# Patient Record
Sex: Female | Born: 1935
Health system: Southern US, Academic
[De-identification: ages and names within clinical notes are randomized; demographics above are authoritative.]

## PROBLEM LIST (undated history)

## (undated) DIAGNOSIS — IMO0002 Reserved for concepts with insufficient information to code with codable children: Secondary | ICD-10-CM

## (undated) DIAGNOSIS — E785 Hyperlipidemia, unspecified: Secondary | ICD-10-CM

## (undated) DIAGNOSIS — K5909 Other constipation: Secondary | ICD-10-CM

## (undated) DIAGNOSIS — F419 Anxiety disorder, unspecified: Secondary | ICD-10-CM

## (undated) DIAGNOSIS — G35 Multiple sclerosis: Secondary | ICD-10-CM

## (undated) DIAGNOSIS — R0981 Nasal congestion: Secondary | ICD-10-CM

---

## 1998-10-06 ENCOUNTER — Ambulatory Visit (HOSPITAL_COMMUNITY): Payer: Self-pay

## 2013-11-16 ENCOUNTER — Emergency Department (HOSPITAL_COMMUNITY): Payer: Medicare PPO

## 2013-11-16 ENCOUNTER — Emergency Department (HOSPITAL_COMMUNITY)
Admission: EM | Admit: 2013-11-16 | Discharge: 2013-11-16 | Disposition: A | Discharge status: Home / Self Care | Payer: Medicare PPO | Attending: Emergency Medicine | Admitting: Emergency Medicine

## 2013-11-16 ENCOUNTER — Encounter (HOSPITAL_COMMUNITY): Payer: Self-pay | Admitting: Emergency Medicine

## 2013-11-16 DIAGNOSIS — S1093XA Contusion of unspecified part of neck, initial encounter: Principal | ICD-10-CM

## 2013-11-16 DIAGNOSIS — IMO0002 Reserved for concepts with insufficient information to code with codable children: Secondary | ICD-10-CM | POA: Insufficient documentation

## 2013-11-16 DIAGNOSIS — Y9210 Unspecified residential institution as the place of occurrence of the external cause: Secondary | ICD-10-CM | POA: Insufficient documentation

## 2013-11-16 DIAGNOSIS — Z79899 Other long term (current) drug therapy: Secondary | ICD-10-CM | POA: Insufficient documentation

## 2013-11-16 DIAGNOSIS — S0083XA Contusion of other part of head, initial encounter: Principal | ICD-10-CM

## 2013-11-16 DIAGNOSIS — Z791 Long term (current) use of non-steroidal anti-inflammatories (NSAID): Secondary | ICD-10-CM | POA: Insufficient documentation

## 2013-11-16 DIAGNOSIS — E785 Hyperlipidemia, unspecified: Secondary | ICD-10-CM | POA: Insufficient documentation

## 2013-11-16 DIAGNOSIS — Z8669 Personal history of other diseases of the nervous system and sense organs: Secondary | ICD-10-CM | POA: Insufficient documentation

## 2013-11-16 DIAGNOSIS — F411 Generalized anxiety disorder: Secondary | ICD-10-CM | POA: Insufficient documentation

## 2013-11-16 DIAGNOSIS — Y9389 Activity, other specified: Secondary | ICD-10-CM | POA: Insufficient documentation

## 2013-11-16 DIAGNOSIS — R296 Repeated falls: Secondary | ICD-10-CM | POA: Insufficient documentation

## 2013-11-16 DIAGNOSIS — M1990 Unspecified osteoarthritis, unspecified site: Secondary | ICD-10-CM | POA: Insufficient documentation

## 2013-11-16 DIAGNOSIS — S0003XA Contusion of scalp, initial encounter: Secondary | ICD-10-CM | POA: Insufficient documentation

## 2013-11-16 HISTORY — DX: Hyperlipidemia, unspecified: E78.5

## 2013-11-16 HISTORY — DX: Reserved for concepts with insufficient information to code with codable children: IMO0002

## 2013-11-16 HISTORY — DX: Multiple sclerosis: G35

## 2013-11-16 HISTORY — DX: Anxiety disorder, unspecified: F41.9

## 2013-11-16 HISTORY — DX: Nasal congestion: R09.81

## 2013-11-16 HISTORY — DX: Other constipation: K59.09

## 2013-11-16 NOTE — ED Provider Notes (Signed)
CSN: [id number]     Arrival date & time 11/16/13  1003 History   First MD Initiated Contact with Patient 11/16/13 1007     Chief Complaint  Patient presents with  . Fall    HPI Patient from assisted living facility fell in the bathroom suffering head injury.  No loss of consciousness.  Patient denies headache or nausea or vomiting.  Patient does have hematoma to the posterior occipital area of the head.  Patient denies neck pain or numbness.  Patient denies hip pain or rib pain. Past Medical History  Diagnosis Date  . Chronic constipation   . Hyperlipidemia   . Anxiety   . Degenerative disc disease   . Multiple sclerosis   . Chronic nasal congestion    History reviewed. No pertinent past surgical history. No family history on file. History  Substance Use Topics  . Smoking status: Never Smoker   . Smokeless tobacco: Never Used  . Alcohol Use: No   OB History   Grav Para Term Preterm Abortions TAB SAB Ect Mult Living                 Review of Systems All other systems reviewed and are negative Allergies  Review of patient's allergies indicates no known allergies.  Home Medications   Current Outpatient Rx  Name  Route  Sig  Dispense  Refill  . acetaminophen (TYLENOL) 500 MG tablet   Oral   Take 500 mg by mouth 2 (two) times daily as needed for moderate pain.         . cholecalciferol (VITAMIN D) 1000 UNITS tablet   Oral   Take 1,000 Units by mouth daily.         Marland Kitchen. docusate sodium (COLACE) 100 MG capsule   Oral   Take 100 mg by mouth 2 (two) times daily.         . fluticasone (FLONASE) 50 MCG/ACT nasal spray   Each Nare   Place 1 spray into both nostrils daily.         Marland Kitchen. levothyroxine (SYNTHROID, LEVOTHROID) 25 MCG tablet   Oral   Take 25 mcg by mouth daily before breakfast.         . magnesium oxide (MAG-OX) 400 MG tablet   Oral   Take 400 mg by mouth at bedtime.         . meloxicam (MOBIC) 15 MG tablet   Oral   Take 15 mg by mouth daily.           . Multiple Vitamin (MULTIVITAMIN WITH MINERALS) TABS tablet   Oral   Take 1 tablet by mouth daily.         Marland Kitchen. omega-3 acid ethyl esters (LOVAZA) 1 G capsule   Oral   Take 1 g by mouth daily.         . potassium chloride (K-DUR,KLOR-CON) 10 MEQ tablet   Oral   Take 10 mEq by mouth daily.         . psyllium (HYDROCIL/METAMUCIL) 95 % PACK   Oral   Take 1 packet by mouth 2 (two) times daily.         . rosuvastatin (CRESTOR) 5 MG tablet   Oral   Take 5 mg by mouth at bedtime.          BP 150/78  Pulse 70  Temp(Src) 97.8 F (36.6 C) (Oral)  Resp 12  SpO2 98% Physical Exam  Nursing note and vitals reviewed. Constitutional: She  is oriented to person, place, and time. She appears well-developed and well-nourished. No distress.  HENT:  Head: Normocephalic.    Eyes: Pupils are equal, round, and reactive to light.  Neck: Normal range of motion.  Cardiovascular: Normal rate and intact distal pulses.   Pulmonary/Chest: No respiratory distress.  Abdominal: Normal appearance. She exhibits no distension.  Musculoskeletal: Normal range of motion.       Right hip: She exhibits normal range of motion and no tenderness.       Left hip: She exhibits normal range of motion and no tenderness.  Neurological: She is alert and oriented to person, place, and time. No cranial nerve deficit.  Skin: Skin is warm and dry. No rash noted.  Psychiatric: She has a normal mood and affect. Her behavior is normal.    ED Course  Procedures (including critical care time) Labs Review Labs Reviewed - No data to display Imaging Review Ct Head Wo Contrast    IMPRESSION: 1. No acute intracranial abnormality. 2. Right occipital scalp hematoma without evidence of underlying calvarial fracture. 3. 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   Electronically Signed   By: Malachy Moan M.D.   On: 11/16/2013 10:35      MDM   1. Scalp hematoma, initial  encounter        Nelia Shi, MD 11/16/13 1043

## 2013-11-16 NOTE — ED Notes (Addendum)
Per EMS pt from assisted living facility fell getting ready in bathroom. Pt sustained hematoma to right posterior portion of head. Pt denies pain at present time. No LOC. Patient remembers entire event.

## 2013-11-16 NOTE — ED Notes (Signed)
Bed: WA20 Expected date: 11/16/13 Expected time: 9:58 AM Means of arrival: Ambulance Comments: Fall

## 2013-11-16 NOTE — Discharge Instructions (Signed)
Contusion A contusion is a deep bruise. Contusions happen when an injury causes bleeding under the skin. Signs of bruising include pain, puffiness (swelling), and discolored skin. The contusion may turn blue, purple, or yellow. HOME CARE   Put ice on the injured area.  Put ice in a plastic bag.  Place a towel between your skin and the bag.  Leave the ice on for 15-20 minutes, 03-04 times a day.  Only take medicine as told by your doctor.  Rest the injured area.  If possible, raise (elevate) the injured area to lessen puffiness. GET HELP RIGHT AWAY IF:   You have more bruising or puffiness.  You have pain that is getting worse.  Your puffiness or pain is not helped by medicine. MAKE SURE YOU:   Understand these instructions.  Will watch your condition.  Will get help right away if you are not doing well or get worse. Document Released: 04/18/2008 Document Revised: 01/23/2012 Document Reviewed: 09/05/2011 [facility] Patient Information 2014 [place], [place].  Head Injury, Adult A head injury happens when the head is hit really hard. A head injury may cause sleepiness, headache, throwing up (vomiting), and problems seeing. If the head injury is really bad, you may need to stay in the hospital. HOME CARE  Have someone with you for the first 24 hours. This person should wake you up every 02-03 hours to check on your condition.  Only drink water or clear fluid for the rest of the day. Then, go back to your regular diet.  Only take medicines as told by your doctor. Do not take aspirin.  Do not drink alcohol for 2 days.  Do not take medicines that help your relax (sedatives) for 2 days. Side effects may happen for up to 7 to 10 days. Watch for new problems. GET HELP RIGHT AWAY IF:   You are confused or sleepy.  You cannot be woken up.  You feel sick to your stomach (nauseous) or keep throwing up.  Your dizziness or unsteadiness is get worse, or your cannot walk.  You  start to shake (convulse) or pass out (faint).  You have very bad, lasting headaches that are not helped by medicine.  You cannot use your arms or legs like normal.  You have clear or bloody fluid coming from your nose or ears. MAKE SURE YOU:   Understand these instructions.  Will watch your condition.  Will get help right away if you are not doing well or get worse. Document Released: 10/13/2008 Document Revised: 01/23/2012 Document Reviewed: 09/16/2009 [facility] Patient Information 2014 [place], [place].

## 2014-03-06 ENCOUNTER — Emergency Department (HOSPITAL_COMMUNITY): Payer: Medicare PPO

## 2014-03-06 ENCOUNTER — Encounter (HOSPITAL_COMMUNITY): Payer: Self-pay | Admitting: Emergency Medicine

## 2014-03-06 ENCOUNTER — Observation Stay (HOSPITAL_COMMUNITY): Payer: Medicare PPO

## 2014-03-06 ENCOUNTER — Observation Stay (HOSPITAL_COMMUNITY)
Admission: EM | Admit: 2014-03-06 | Discharge: 2014-03-09 | Disposition: A | Discharge status: Skilled Nursing Facility | Payer: Medicare PPO | Attending: General Surgery | Admitting: General Surgery

## 2014-03-06 DIAGNOSIS — Y92009 Unspecified place in unspecified non-institutional (private) residence as the place of occurrence of the external cause: Secondary | ICD-10-CM | POA: Insufficient documentation

## 2014-03-06 DIAGNOSIS — W010XXA Fall on same level from slipping, tripping and stumbling without subsequent striking against object, initial encounter: Secondary | ICD-10-CM | POA: Insufficient documentation

## 2014-03-06 DIAGNOSIS — M79609 Pain in unspecified limb: Secondary | ICD-10-CM | POA: Insufficient documentation

## 2014-03-06 DIAGNOSIS — J3489 Other specified disorders of nose and nasal sinuses: Secondary | ICD-10-CM | POA: Insufficient documentation

## 2014-03-06 DIAGNOSIS — W19XXXA Unspecified fall, initial encounter: Secondary | ICD-10-CM

## 2014-03-06 DIAGNOSIS — S42109A Fracture of unspecified part of scapula, unspecified shoulder, initial encounter for closed fracture: Secondary | ICD-10-CM

## 2014-03-06 DIAGNOSIS — IMO0002 Reserved for concepts with insufficient information to code with codable children: Secondary | ICD-10-CM | POA: Insufficient documentation

## 2014-03-06 DIAGNOSIS — E785 Hyperlipidemia, unspecified: Secondary | ICD-10-CM | POA: Insufficient documentation

## 2014-03-06 DIAGNOSIS — S2249XA Multiple fractures of ribs, unspecified side, initial encounter for closed fracture: Principal | ICD-10-CM | POA: Diagnosis present

## 2014-03-06 DIAGNOSIS — M25551 Pain in right hip: Secondary | ICD-10-CM

## 2014-03-06 DIAGNOSIS — K5900 Constipation, unspecified: Secondary | ICD-10-CM | POA: Insufficient documentation

## 2014-03-06 DIAGNOSIS — S2241XA Multiple fractures of ribs, right side, initial encounter for closed fracture: Secondary | ICD-10-CM

## 2014-03-06 DIAGNOSIS — S42101A Fracture of unspecified part of scapula, right shoulder, initial encounter for closed fracture: Secondary | ICD-10-CM | POA: Diagnosis present

## 2014-03-06 DIAGNOSIS — G35 Multiple sclerosis: Secondary | ICD-10-CM | POA: Insufficient documentation

## 2014-03-06 DIAGNOSIS — S42199A Fracture of other part of scapula, unspecified shoulder, initial encounter for closed fracture: Secondary | ICD-10-CM | POA: Insufficient documentation

## 2014-03-06 DIAGNOSIS — N390 Urinary tract infection, site not specified: Secondary | ICD-10-CM | POA: Insufficient documentation

## 2014-03-06 DIAGNOSIS — S2239XA Fracture of one rib, unspecified side, initial encounter for closed fracture: Secondary | ICD-10-CM

## 2014-03-06 LAB — COMPREHENSIVE METABOLIC PANEL
ALK PHOS: 89 U/L (ref 39–117)
ALT: 20 U/L (ref 0–35)
AST: 29 U/L (ref 0–37)
Albumin: 3.8 g/dL (ref 3.5–5.2)
BILIRUBIN TOTAL: 0.4 mg/dL (ref 0.3–1.2)
BUN: 33 mg/dL — AB (ref 6–23)
CHLORIDE: 102 meq/L (ref 96–112)
CO2: 28 meq/L (ref 19–32)
Calcium: 9.4 mg/dL (ref 8.4–10.5)
Creatinine, Ser: 0.53 mg/dL (ref 0.50–1.10)
GFR calc non Af Amer: 79 mL/min — ABNORMAL LOW (ref >90)
GLUCOSE: 106 mg/dL — AB (ref 70–99)
POTASSIUM: 4.2 meq/L (ref 3.7–5.3)
SODIUM: 142 meq/L (ref 137–147)
TOTAL PROTEIN: 6.9 g/dL (ref 6.0–8.3)

## 2014-03-06 LAB — URINALYSIS, ROUTINE W REFLEX MICROSCOPIC
BILIRUBIN URINE: NEGATIVE
Glucose, UA: NEGATIVE mg/dL
Hgb urine dipstick: NEGATIVE
KETONES UR: 15 mg/dL — AB
Leukocytes, UA: NEGATIVE
NITRITE: POSITIVE — AB
PROTEIN: NEGATIVE mg/dL
Specific Gravity, Urine: 1.02 (ref 1.005–1.030)
Urobilinogen, UA: 0.2 mg/dL (ref 0.0–1.0)
pH: 7 (ref 5.0–8.0)

## 2014-03-06 LAB — CBC WITH DIFFERENTIAL/PLATELET
Basophils Absolute: 0 10*3/uL (ref 0.0–0.1)
Basophils Relative: 0 % (ref 0–1)
EOS ABS: 0 10*3/uL (ref 0.0–0.7)
Eosinophils Relative: 0 % (ref 0–5)
HCT: 42 % (ref 36.0–46.0)
Hemoglobin: 13.5 g/dL (ref 12.0–15.0)
LYMPHS PCT: 8 % — AB (ref 12–46)
Lymphs Abs: 0.9 10*3/uL (ref 0.7–4.0)
MCH: 30 pg (ref 26.0–34.0)
MCHC: 32.1 g/dL (ref 30.0–36.0)
MCV: 93.3 fL (ref 78.0–100.0)
MONOS PCT: 5 % (ref 3–12)
Monocytes Absolute: 0.6 10*3/uL (ref 0.1–1.0)
NEUTROS ABS: 10.4 10*3/uL — AB (ref 1.7–7.7)
NEUTROS PCT: 87 % — AB (ref 43–77)
PLATELETS: 168 10*3/uL (ref 150–400)
RBC: 4.5 MIL/uL (ref 3.87–5.11)
RDW: 13.3 % (ref 11.5–15.5)
WBC: 12 10*3/uL — AB (ref 4.0–10.5)

## 2014-03-06 LAB — URINE MICROSCOPIC-ADD ON

## 2014-03-06 MED ORDER — FAMOTIDINE 20 MG PO TABS
20.0000 mg | ORAL_TABLET | Freq: Every day | ORAL | Status: DC
  Administered 2014-03-06 – 2014-03-08 (×3): 20 mg via ORAL
  Filled 2014-03-06 (×5): qty 1

## 2014-03-06 MED ORDER — MELOXICAM 7.5 MG PO TABS
7.5000 mg | ORAL_TABLET | Freq: Every day | ORAL | Status: DC | PRN
  Filled 2014-03-06: qty 1

## 2014-03-06 MED ORDER — FENTANYL CITRATE 0.05 MG/ML IJ SOLN
25.0000 ug | Freq: Once | INTRAMUSCULAR | Status: AC
Start: 2014-03-06 — End: 2014-03-06
  Administered 2014-03-06: 25 ug via INTRAVENOUS
  Filled 2014-03-06: qty 2

## 2014-03-06 MED ORDER — DEXTROSE-NACL 5-0.45 % IV SOLN
INTRAVENOUS | Status: DC
  Administered 2014-03-06 – 2014-03-07 (×2): via INTRAVENOUS

## 2014-03-06 MED ORDER — FENTANYL CITRATE 0.05 MG/ML IJ SOLN
50.0000 ug | Freq: Once | INTRAMUSCULAR | Status: AC
  Administered 2014-03-06: 50 ug via INTRAVENOUS
  Filled 2014-03-06: qty 2

## 2014-03-06 MED ORDER — ONDANSETRON HCL 4 MG/2ML IJ SOLN: 4.0000 mg | Freq: Four times a day (QID) | INTRAMUSCULAR | Status: DC | PRN

## 2014-03-06 MED ORDER — NEPAFENAC 0.1 % OP SUSP
1.0000 [drp] | Freq: Three times a day (TID) | OPHTHALMIC | Status: DC
  Administered 2014-03-06 – 2014-03-09 (×9): 1 [drp] via OPHTHALMIC
  Filled 2014-03-06: qty 3

## 2014-03-06 MED ORDER — POTASSIUM CHLORIDE CRYS ER 10 MEQ PO TBCR
10.0000 meq | EXTENDED_RELEASE_TABLET | Freq: Every day | ORAL | Status: DC
  Administered 2014-03-06 – 2014-03-09 (×4): 10 meq via ORAL
  Filled 2014-03-06 (×4): qty 1

## 2014-03-06 MED ORDER — HYDROCODONE-ACETAMINOPHEN 5-325 MG PO TABS: 0.5000 | ORAL_TABLET | Freq: Four times a day (QID) | ORAL | Status: DC | PRN

## 2014-03-06 MED ORDER — POLYETHYL GLYCOL-PROPYL GLYCOL 0.4-0.3 % OP SOLN: 1.0000 [drp] | Freq: Every evening | OPHTHALMIC | Status: DC | PRN

## 2014-03-06 MED ORDER — DOCUSATE SODIUM 100 MG PO CAPS
100.0000 mg | ORAL_CAPSULE | Freq: Two times a day (BID) | ORAL | Status: DC
  Administered 2014-03-06 – 2014-03-09 (×6): 100 mg via ORAL
  Filled 2014-03-06 (×6): qty 1

## 2014-03-06 MED ORDER — POLYVINYL ALCOHOL 1.4 % OP SOLN
1.0000 [drp] | Freq: Every evening | OPHTHALMIC | Status: DC | PRN
  Filled 2014-03-06: qty 15

## 2014-03-06 MED ORDER — IOHEXOL 300 MG/ML  SOLN
80.0000 mL | Freq: Once | INTRAMUSCULAR | Status: AC | PRN
Start: 2014-03-06 — End: 2014-03-06
  Administered 2014-03-06: 80 mL via INTRAVENOUS

## 2014-03-06 MED ORDER — FLUTICASONE PROPIONATE 50 MCG/ACT NA SUSP
1.0000 | Freq: Two times a day (BID) | NASAL | Status: DC | PRN
  Filled 2014-03-06: qty 16

## 2014-03-06 MED ORDER — PSYLLIUM 95 % PO PACK
1.0000 | PACK | Freq: Two times a day (BID) | ORAL | Status: DC
  Administered 2014-03-06 – 2014-03-09 (×6): 1 via ORAL
  Filled 2014-03-06 (×7): qty 1

## 2014-03-06 MED ORDER — ONDANSETRON HCL 4 MG PO TABS: 4.0000 mg | ORAL_TABLET | Freq: Four times a day (QID) | ORAL | Status: DC | PRN

## 2014-03-06 MED ORDER — ENOXAPARIN SODIUM 30 MG/0.3ML ~~LOC~~ SOLN
30.0000 mg | SUBCUTANEOUS | Status: DC
  Administered 2014-03-06 – 2014-03-08 (×3): 30 mg via SUBCUTANEOUS
  Filled 2014-03-06 (×4): qty 0.3

## 2014-03-06 MED ORDER — ATORVASTATIN CALCIUM 10 MG PO TABS
10.0000 mg | ORAL_TABLET | Freq: Every day | ORAL | Status: DC
  Administered 2014-03-07 – 2014-03-08 (×2): 10 mg via ORAL
  Filled 2014-03-06 (×3): qty 1

## 2014-03-06 MED ORDER — LEVOTHYROXINE SODIUM 25 MCG PO TABS
25.0000 ug | ORAL_TABLET | Freq: Every day | ORAL | Status: DC
  Administered 2014-03-07 – 2014-03-09 (×3): 25 ug via ORAL
  Filled 2014-03-06 (×4): qty 1

## 2014-03-06 MED ORDER — MORPHINE SULFATE 2 MG/ML IJ SOLN
1.0000 mg | INTRAMUSCULAR | Status: DC | PRN
  Administered 2014-03-06: 1 mg via INTRAVENOUS
  Administered 2014-03-07: 2 mg via INTRAVENOUS
  Filled 2014-03-06 (×2): qty 1

## 2014-03-06 MED ORDER — VITAMIN D3 25 MCG (1000 UNIT) PO TABS
1000.0000 [IU] | ORAL_TABLET | Freq: Every day | ORAL | Status: DC
  Administered 2014-03-06 – 2014-03-09 (×4): 1000 [IU] via ORAL
  Filled 2014-03-06 (×4): qty 1

## 2014-03-06 MED ORDER — NEPAFENAC 0.3 % OP SUSP: 1.0000 [drp] | Freq: Every day | OPHTHALMIC | Status: DC

## 2014-03-06 NOTE — ED Provider Notes (Signed)
S: [facility] 80 [facility] after turning in bathroom to put on clothes. She complains of R sided chest pain, R arm pain, R hip pain. On PE, VSS, pt in NAD. On PE, She has no sings of trauma to hea, no c-spine tenderness.  CT scans showed right 3 through 12 rib fractures, no femur fracture. She was accepted by Dr. Lindie SpruceWyatt was trauma surgery, and was transported here in stable condition. Dr. Roda ShuttersXu was also consulted as recommended MRI of the right hip.  O:  Filed Vitals:   03/06/14 1623  BP: 132/64  Pulse: 75  Temp:   Resp: 20   Patient has no neuro deficits. She has a GCS of 15. Heart and lung sounds are within normal limits. Breath sounds are equal bilaterally. Patient has normal blood pressure, heart rate. Secondary examination reveals exquisite tenderness to palpation in the right chest wall. Also reveals right hip pain. All extremities are neurovascularly intact.  A:  In summary, this is a 80 year old female, presenting from outside hospital after being accepted by trauma surgery, with imaging evidence of right rib fractures, with a stable examination, exquisite tenderness to palpation in the right chest wall , right hip. Patient is neurovascularly intact in all extremities.  P:  Considering her age, amount of rib fractures, the patient statistically has a higher rate of morbidity and mortality. Will followup trauma recommendations, orthopedic recommendations. We'll continue to monitor closely.  Patient has been admitted to the trauma service. She is being transported in stable condition.  Lindsey BostonStirling Maleeyah Mccaughey, MD 03/07/14 [phone]

## 2014-03-06 NOTE — ED Notes (Signed)
Pt at CT

## 2014-03-06 NOTE — ED Notes (Signed)
[facility] at bedside to transport pt 

## 2014-03-06 NOTE — Progress Notes (Signed)
[facility]anePeter [facility]imes of [facility] and also listed on facility demographic sheet is [facility] Grimes as pcp [facility] [phone]

## 2014-03-06 NOTE — ED Notes (Addendum)
Pt now states that she may have hit her head. Skin tear to L forearm. Bleeding minimal

## 2014-03-06 NOTE — ED Notes (Signed)
Bed: WA09 Expected date:  Expected time:  Means of arrival:  Comments: EMS 80yo F - Fall

## 2014-03-06 NOTE — ED Provider Notes (Signed)
CSN: [id number]     Arrival date & time 03/06/14  0905 History   First MD Initiated Contact with Patient 03/06/14 [phone]     Chief Complaint  Patient presents with  . Fall  . Arm Pain  . Chest Pain     (Consider location/radiation/quality/duration/timing/severity/associated sxs/prior Treatment) HPI Comments: Pt is a 80 y.o. female with Pmhx as above who presents with mechanical fall this morning after turning in bathroom to put on clothes. Denies dizziness. She states she landed on her back, but hit her head. No LOC.  She complains of R sided chest pain, R arm pain, R hip pain. She has chronic R sided weakness from MS, but states it is no different.   Patient is a 80 y.o. female presenting with fall. The history is provided by the patient. No language interpreter was used.  Fall This is a new problem. The current episode started less than 1 hour ago. The problem occurs rarely. The problem has not changed since onset.Associated symptoms include chest pain and abdominal pain. Pertinent negatives include no headaches and no shortness of breath. Associated symptoms comments: R arm pain, R hip pain. Exacerbated by: movement. Nothing relieves the symptoms. Treatments tried: immobilization. The treatment provided mild relief.    Past Medical History  Diagnosis Date  . Chronic constipation   . Hyperlipidemia   . Anxiety   . Degenerative disc disease   . Multiple sclerosis   . Chronic nasal congestion    History reviewed. No pertinent past surgical history. History reviewed. No pertinent family history. History  Substance Use Topics  . Smoking status: Never Smoker   . Smokeless tobacco: Never Used  . Alcohol Use: No   OB History   Grav Para Term Preterm Abortions TAB SAB Ect Mult Living                 Review of Systems  Constitutional: Negative for fever, chills, diaphoresis, activity change, appetite change and fatigue.  HENT: Negative for congestion, facial swelling, rhinorrhea and  sore throat.   Eyes: Negative for photophobia and discharge.  Respiratory: Negative for cough, chest tightness and shortness of breath.   Cardiovascular: Positive for chest pain. Negative for palpitations and leg swelling.  Gastrointestinal: Positive for abdominal pain. Negative for nausea, vomiting and diarrhea.  Endocrine: Negative for polydipsia and polyuria.  Genitourinary: Negative for dysuria, frequency, difficulty urinating and pelvic pain.  Musculoskeletal: Positive for arthralgias. Negative for back pain, neck pain and neck stiffness.  Skin: Negative for color change and wound.  Allergic/Immunologic: Negative for immunocompromised state.  Neurological: Negative for facial asymmetry, weakness, numbness and headaches.  Hematological: Does not bruise/bleed easily.  Psychiatric/Behavioral: Negative for confusion and agitation.      Allergies  Review of patient's allergies indicates no known allergies.  Home Medications   Prior to Admission medications   Medication Sig Start Date End Date Taking? Authorizing Provider  Multiple Vitamin (MULTIVITAMIN WITH MINERALS) TABS tablet Take 1 tablet by mouth daily.   Yes Historical Provider, MD  rosuvastatin (CRESTOR) 5 MG tablet Take 5 mg by mouth at bedtime.   Yes Historical Provider, MD  acetaminophen (TYLENOL) 500 MG tablet Take 500 mg by mouth 2 (two) times daily as needed for moderate pain.    Historical Provider, MD  cholecalciferol (VITAMIN D) 1000 UNITS tablet Take 1,000 Units by mouth daily.    Historical Provider, MD  docusate sodium (COLACE) 100 MG capsule Take 100 mg by mouth 2 (two) times daily.  Historical Provider, MD  fluticasone (FLONASE) 50 MCG/ACT nasal spray Place 1 spray into both nostrils daily.    Historical Provider, MD  levothyroxine (SYNTHROID, LEVOTHROID) 25 MCG tablet Take 25 mcg by mouth daily before breakfast.    Historical Provider, MD  magnesium oxide (MAG-OX) 400 MG tablet Take 400 mg by mouth at bedtime.     Historical Provider, MD  meloxicam (MOBIC) 15 MG tablet Take 15 mg by mouth daily.    Historical Provider, MD  omega-3 acid ethyl esters (LOVAZA) 1 G capsule Take 1 g by mouth daily.    Historical Provider, MD  potassium chloride (K-DUR,KLOR-CON) 10 MEQ tablet Take 10 mEq by mouth daily.    Historical Provider, MD  psyllium (HYDROCIL/METAMUCIL) 95 % PACK Take 1 packet by mouth 2 (two) times daily.    Historical Provider, MD   BP 122/48  Pulse 61  Temp(Src) 97.7 F (36.5 C) (Oral)  Resp 18  Ht 5\' 2"  (1.575 m)  Wt 120 lb (54.432 kg)  BMI 21.94 kg/m2  SpO2 99% Physical Exam  Constitutional: She is oriented to person, place, and time. She appears well-developed and well-nourished. No distress.  HENT:  Head: Normocephalic and atraumatic.  Mouth/Throat: No oropharyngeal exudate.  Eyes: Pupils are equal, round, and reactive to light.  Neck: Normal range of motion. Neck supple.  Cardiovascular: Normal rate, regular rhythm and normal heart sounds.  Exam reveals no gallop and no friction rub.   No murmur heard. Pulmonary/Chest: Effort normal and breath sounds normal. No respiratory distress. She has no wheezes. She has no rales. She exhibits tenderness.    Abdominal: Soft. Bowel sounds are normal. She exhibits no distension and no mass. There is no tenderness. There is no rebound and no guarding.    Musculoskeletal: Normal range of motion. She exhibits no edema.       Right shoulder: She exhibits bony tenderness and crepitus.       Right hip: She exhibits tenderness and bony tenderness. She exhibits no swelling, no crepitus and no deformity.       Arms:      Legs: Neurological: She is alert and oriented to person, place, and time. She displays no tremor. No sensory deficit. Abnormal gait: not tested due to injuries. GCS eye subscore is 4. GCS verbal subscore is 5. GCS motor subscore is 6.  Dec strength of R arm, R leg due to pain  Skin: Skin is warm and dry.  Psychiatric: She has a  normal mood and affect.    ED Course  Procedures (including critical care time) Labs Review Labs Reviewed  CBC WITH DIFFERENTIAL - Abnormal; Notable for the following:    WBC 12.0 (*)    Neutrophils Relative % 87 (*)    Neutro Abs 10.4 (*)    Lymphocytes Relative 8 (*)    All other components within normal limits  COMPREHENSIVE METABOLIC PANEL - Abnormal; Notable for the following:    Glucose, Bld 106 (*)    BUN 33 (*)    GFR calc non Af Amer 79 (*)    All other components within normal limits  URINALYSIS, ROUTINE W REFLEX MICROSCOPIC - Abnormal; Notable for the following:    APPearance CLOUDY (*)    Ketones, ur 15 (*)    Nitrite POSITIVE (*)    All other components within normal limits  URINE MICROSCOPIC-ADD ON - Abnormal; Notable for the following:    Bacteria, UA MANY (*)    All other components within normal  limits  URINE CULTURE  CBC  BASIC METABOLIC PANEL    Imaging Review Dg Chest 2 View  03/06/2014   CLINICAL DATA:  Fall.  Posterior right shoulder pain.  EXAM: CHEST  2 VIEW  COMPARISON:  None.  FINDINGS: The heart is mildly enlarged. There is no edema or effusion to suggest failure. Emphysematous changes are noted. Arm positioning obscures the upper mediastinum. The visualized soft tissues and bony thorax are unremarkable.  IMPRESSION: 1. Borderline cardiomegaly without failure. 2. Emphysema.   Electronically Signed   By: Gennette Pac M.D.   On: 03/06/2014 11:59   Dg Shoulder Right  03/06/2014   CLINICAL DATA:  Fall.  Posterior right shoulder pain.  EXAM: RIGHT SHOULDER - 2+ VIEW  COMPARISON:  None.  FINDINGS: The right shoulder is located. Calcification is noted along the rotator cuff tendon. A nondisplaced right lateral eighth rib fracture is present. There is no pneumothorax.  IMPRESSION: 1. Calcification along the rotator cuff insertion is likely chronic. 2. Minimally displaced right lateral eighth rib fracture without pneumothorax.   Electronically Signed   By:  Gennette Pac M.D.   On: 03/06/2014 12:02   Dg Hip Complete Right  03/06/2014   CLINICAL DATA:  FALL ARM PAIN CHEST PAIN  EXAM: RIGHT HIP - COMPLETE 2+ VIEW  COMPARISON:  None.  FINDINGS: There are areas of cortical irregularity along the lateral subcapital region of the right femoral neck. There is a compression of the trabecula markings along the subcapital region of the femoral neck. These findings most prominent on the frontal view.  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 Severe periarticular sclerosis is appreciated within the right hip.  The bones are osteopenic. Degenerative changes appreciated within the lower lumbar spine. Osteoarthritic changes are identified within the left hip though to a lesser extent. Atherosclerotic calcifications are appreciated.  IMPRESSION: Findings consistent with a subcapital femoral neck fracture on the right.  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 Osteoarthritic changes to a lesser extent appreciated within the left hip.  Spondylosis lower lumbar spine.   Electronically Signed   By: Salome Holmes M.D.   On: 03/06/2014 12:05   Ct Head Wo Contrast  03/06/2014   CLINICAL DATA:  History of fall  EXAM: CT HEAD WITHOUT CONTRAST  CT CERVICAL SPINE WITHOUT CONTRAST  TECHNIQUE: Multidetector CT imaging of the head and cervical spine was performed following the standard protocol without intravenous contrast. Multiplanar CT image reconstructions of the cervical spine were also generated.  COMPARISON:  CT HEAD W/O CM dated 11/16/2013  FINDINGS: CT HEAD FINDINGS  No acute intracranial abnormality. Specifically, no hemorrhage, hydrocephalus, mass lesion, acute infarction, or significant intracranial injury. No acute calvarial abnormality. Stable global atrophy. Areas of low attenuation within the subcortical, deep, and periventricular white matter regions consistent  with small vessel white matter ischaemia. The visualized paranasal sinuses and mastoid air cells are patent.  CT CERVICAL SPINE FINDINGS  There is no evidence of acute fracture nor dislocation. Areas of hypertrophic spurring along the dens, and peripheral endplates of C3 through C7. Multilevel disc space narrowing from the C3-4 through the C7-T1. These areas also demonstrate endplate sclerosis and regions of endplate cyst formation. Mild areas of anterolisthesis at C4-5 C5-6 and to a lesser extent C7-T1. Areas of facet sclerosis, hypertrophy and joint space narrowing in the mid cervical spine. There is no evidence of canal stenosis no neural foraminal  narrowing.  There is levoscoliosis, and anterior tilt of the cervical spine. The anterior tilt is likely due to the increased thoracic kyphosis.  IMPRESSION: 1. No acute intracranial abnormality. Stable chronic and involutional changes. 2. Multilevel multifactorial spondylosis without acute osseous abnormalities. Areas of mild anterolisthesis are likely secondary to degenerative change.   Electronically Signed   By: Salome Holmes M.D.   On: 03/06/2014 13:40   Ct Chest W Contrast  03/06/2014   ADDENDUM REPORT: 03/06/2014 14:09  ADDENDUM: The original report was by Dr. Gaylyn Rong. The following addendum is by Dr. Gaylyn Rong:  I received a phone call with the question regarding the right proximal femur.  There is a rim of osteophyte formation on the right femoral neck which simulated fracture on the prior radiographs. CT does not reveal a definite fracture in this vicinity, I favor the appearance being due to the severe spurring and severe underlying arthropathy. If the patient is truly unable to bear weight enhance focal hip pain, MRI could prove useful for definitive assessment.   Electronically Signed   By: Herbie Baltimore M.D.   On: 03/06/2014 14:09   03/06/2014   CLINICAL DATA:  The patient slipped and fall fell, landing on the right side, and has  right lateral rib pain. Rib fractures on chest radiography.  EXAM: CT CHEST, ABDOMEN, AND PELVIS WITH CONTRAST  TECHNIQUE: Multidetector CT imaging of the chest, abdomen and pelvis was performed following the standard protocol during bolus administration of intravenous contrast.  CONTRAST:  80mL OMNIPAQUE IOHEXOL 300 MG/ML  SOLN  COMPARISON:  03/06/2012  FINDINGS: CT CHEST FINDINGS  No pathologic thoracic adenopathy.  Small right pleural effusion noted with acute mildly displaced fracture of the right inferior scapula.  There are acute fractures of the the right-sided third through twelfth ribs, most of which are not nondisplaced, but several of which are mildly displaced. The tenth and eleventh rib fractures on the right are segmental.  Degenerative appearing lower cervical subluxations are partially included on today's exam. I do not observe a thoracic spine compression fracture.  No pneumothorax is observed, although there is a tiny amount of gas along the right eleventh rib fracture posteriorly which may represent nitrogen gas phenomenon. Mild biapical pleural parenchymal scarring. Subsegmental atelectasis noted in both lower lobes. There is degenerative sternoclavicular arthropathy bilaterally.  CT ABDOMEN AND PELVIS FINDINGS  If subtle hypodense lesions are present anteriorly in segment 4A of the liver somewhat atrophic upper left hepatic lobe noted, with linear lucency tracking between the medial and lateral segments, an with mild periportal scarring in the right hepatic lobe.  The spleen and pancreas appear normal.  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 No perihepatic ascites. Gallbladder unremarkable.  Several small hypodense lesions are present in the right kidney. Mild scarring in the left kidney. If  No pathologic upper abdominal adenopathy is observed. No pathologic pelvic adenopathy is observed.  Urinary bladder unremarkable. There is severe degenerative arthropathy of the hips, right greater  than left, with flattening of the right femoral head, right hip effusion, exuberant spurring on the right. Calcifications are present along the joint capsules bilaterally.  Lumbar spondylosis and degenerative disc disease noted.  Uterine contour unremarkable.  IMPRESSION: 1. Acute fractures of the right-sided third through twelfth ribs. The tenth and eleventh rib fractures are segmental. 2. Small right pleural effusion. 3. Acute fracture the right inferior scapula. 4. Unusual appearance of the liver with suspected scarring/atrophy along portions of the left hepatic lobe and  anterior right hepatic lobe, and mild intrahepatic biliary dilatation. I do not observe a discrete tumor and given the lack of perihepatic ascites I doubt that the areas of suspected scarring are actually due to hepatic lacerations. 5. 1 cm splenic artery aneurysm. 6. Spondylosis. 7. Severe bilateral hip arthropathy, right greater than left.  Electronically Signed: By: Herbie BaltimoreWalt  Liebkemann M.D. On: 03/06/2014 13:43   Ct Cervical Spine Wo Contrast  03/06/2014   CLINICAL DATA:  History of fall  EXAM: CT HEAD WITHOUT CONTRAST  CT CERVICAL SPINE WITHOUT CONTRAST  TECHNIQUE: Multidetector CT imaging of the head and cervical spine was performed following the standard protocol without intravenous contrast. Multiplanar CT image reconstructions of the cervical spine were also generated.  COMPARISON:  CT HEAD W/O CM dated 11/16/2013  FINDINGS: CT HEAD FINDINGS  No acute intracranial abnormality. Specifically, no hemorrhage, hydrocephalus, mass lesion, acute infarction, or significant intracranial injury. No acute calvarial abnormality. Stable global atrophy. Areas of low attenuation within the subcortical, deep, and periventricular white matter regions consistent with small vessel white matter ischaemia. The visualized paranasal sinuses and mastoid air cells are patent.  CT CERVICAL SPINE FINDINGS  There is no evidence of acute fracture nor dislocation. Areas  of hypertrophic spurring along the dens, and peripheral endplates of C3 through C7. Multilevel disc space narrowing from the C3-4 through the C7-T1. These areas also demonstrate endplate sclerosis and regions of endplate cyst formation. Mild areas of anterolisthesis at C4-5 C5-6 and to a lesser extent C7-T1. Areas of facet sclerosis, hypertrophy and joint space narrowing in the mid cervical spine. There is no evidence of canal stenosis no neural foraminal narrowing.  There is levoscoliosis, and anterior tilt of the cervical spine. The anterior tilt is likely due to the increased thoracic kyphosis.  IMPRESSION: 1. No acute intracranial abnormality. Stable chronic and involutional changes. 2. Multilevel multifactorial spondylosis without acute osseous abnormalities. Areas of mild anterolisthesis are likely secondary to degenerative change.   Electronically Signed   By: Salome HolmesHector  Cooper M.D.   On: 03/06/2014 13:40   Ct Abdomen Pelvis W Contrast  03/06/2014   ADDENDUM REPORT: 03/06/2014 14:09  ADDENDUM: The original report was by Dr. Gaylyn RongWalter Liebkemann. The following addendum is by Dr. Gaylyn RongWalter Liebkemann:  I received a phone call with the question regarding the right proximal femur.  There is a rim of osteophyte formation on the right femoral neck which simulated fracture on the prior radiographs. CT does not reveal a definite fracture in this vicinity, I favor the appearance being due to the severe spurring and severe underlying arthropathy. If the patient is truly unable to bear weight enhance focal hip pain, MRI could prove useful for definitive assessment.   Electronically Signed   By: Herbie BaltimoreWalt  Liebkemann M.D.   On: 03/06/2014 14:09   03/06/2014   CLINICAL DATA:  The patient slipped and fall fell, landing on the right side, and has right lateral rib pain. Rib fractures on chest radiography.  EXAM: CT CHEST, ABDOMEN, AND PELVIS WITH CONTRAST  TECHNIQUE: Multidetector CT imaging of the chest, abdomen and pelvis was  performed following the standard protocol during bolus administration of intravenous contrast.  CONTRAST:  80mL OMNIPAQUE IOHEXOL 300 MG/ML  SOLN  COMPARISON:  03/06/2012  FINDINGS: CT CHEST FINDINGS  No pathologic thoracic adenopathy.  Small right pleural effusion noted with acute mildly displaced fracture of the right inferior scapula.  There are acute fractures of the the right-sided third through twelfth ribs, most of which are not nondisplaced,  but several of which are mildly displaced. The tenth and eleventh rib fractures on the right are segmental.  Degenerative appearing lower cervical subluxations are partially included on today's exam. I do not observe a thoracic spine compression fracture.  No pneumothorax is observed, although there is a tiny amount of gas along the right eleventh rib fracture posteriorly which may represent nitrogen gas phenomenon. Mild biapical pleural parenchymal scarring. Subsegmental atelectasis noted in both lower lobes. There is degenerative sternoclavicular arthropathy bilaterally.  CT ABDOMEN AND PELVIS FINDINGS  If subtle hypodense lesions are present anteriorly in segment 4A of the liver somewhat atrophic upper left hepatic lobe noted, with linear lucency tracking between the medial and lateral segments, an with mild periportal scarring in the right hepatic lobe.  The spleen and pancreas appear normal.  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 No perihepatic ascites. Gallbladder unremarkable.  Several small hypodense lesions are present in the right kidney. Mild scarring in the left kidney. If  No pathologic upper abdominal adenopathy is observed. No pathologic pelvic adenopathy is observed.  Urinary bladder unremarkable. There is severe degenerative arthropathy of the hips, right greater than left, with flattening of the right femoral head, right hip effusion, exuberant spurring on the right. Calcifications are present along the joint capsules bilaterally.  Lumbar  spondylosis and degenerative disc disease noted.  Uterine contour unremarkable.  IMPRESSION: 1. Acute fractures of the right-sided third through twelfth ribs. The tenth and eleventh rib fractures are segmental. 2. Small right pleural effusion. 3. Acute fracture the right inferior scapula. 4. Unusual appearance of the liver with suspected scarring/atrophy along portions of the left hepatic lobe and anterior right hepatic lobe, and mild intrahepatic biliary dilatation. I do not observe a discrete tumor and given the lack of perihepatic ascites I doubt that the areas of suspected scarring are actually due to hepatic lacerations. 5. 1 cm splenic artery aneurysm. 6. Spondylosis. 7. Severe bilateral hip arthropathy, right greater than left.  Electronically Signed: By: Herbie Baltimore M.D. On: 03/06/2014 13:43     EKG Interpretation   Date/Time:  Thursday March 06 2014 09:35:09 EDT Ventricular Rate:  62 PR Interval:  189 QRS Duration: 84 QT Interval:  459 QTC Calculation: 466 R Axis:   36 Text Interpretation:  Sinus rhythm Low voltage, precordial leads Artifact  no prior for comparison Confirmed by Darryl Blumenstein  MD, Zani Kyllonen ([id number]) on  03/06/2014 9:42:02 AM      MDM   Final diagnoses:  Fall  Multiple closed fractures of ribs of right side  Right hip pain    Pt is a 80 y.o. female with Pmhx as above who presents with mechanical fall this morning after turning in bathroom to put on clothes. Denies dizziness. She states she landed on her back, but hit her head. No LOC.  She complains of R sided chest pain, R arm pain, R hip pain. On PE, VSS, pt in NAD. On PE, She has no sings of trauma to hea, no c-spine tenderness. +tenderness/crepitus at prox humerus, +ttp R chest wall, R abdomen, R R hip. No back tenderness  Imaging with R subcapital femoral neck fracture, R lateral 8th rib fracture on plain films.  CT chest, ab/pelvis pending. Will consult ortho.   CTs show R 3-12 rib fractures, does not show femur  fx, but does have severe degenerative changes. Spoken to Dr. Lindie Spruce with Trauma surgery who rec pt transfer to [facility] ED for eval. Dr. Roda Shutters with ortho made aware of change of  R hip findings. He rec MRI for continued pain.         Shanna Cisco, MD 03/06/14 Ernestina [place]

## 2014-03-06 NOTE — H&P (Signed)
History   Lindsey Grimes is an 80 y.o. female.   Chief Complaint:  Chief Complaint  Patient presents with  . Fall  . Arm Pain  . Chest Pain    Fall This is a new problem. The current episode started today. The problem occurs rarely. The problem has been unchanged. Associated symptoms include chest pain. Nothing aggravates the symptoms. She has tried rest and lying down for the symptoms. The treatment provided moderate relief.  Arm Pain Associated symptoms include chest pain.  Chest Pain Ground level fall from standing while trying to get dressed.  Patient states that she was trying to get dressed.  May have hit  Her head, but no LOC.  Complaining of right chest wall pain and some mild right hip pain.  Past Medical History  Diagnosis Date  . Chronic constipation   . Hyperlipidemia   . Anxiety   . Degenerative disc disease   . Multiple sclerosis   . Chronic nasal congestion     History reviewed. No pertinent past surgical history.  History reviewed. No pertinent family history. Social History:  reports that she has never smoked. She has never used smokeless tobacco. She reports that she does not drink alcohol or use illicit drugs.  Allergies  No Known Allergies  Home Medications   (Not in a hospital admission)  Trauma Course   Results for orders placed during the hospital encounter of 03/06/14 (from the past 48 hour(s))  CBC WITH DIFFERENTIAL     Status: Abnormal   Collection Time    03/06/14 11:10 AM      Result Value Ref Range   WBC 12.0 (*) 4.0 - 10.5 K/uL   RBC 4.50  3.87 - 5.11 MIL/uL   Hemoglobin 13.5  12.0 - 15.0 g/dL   HCT 42.0  36.0 - 46.0 %   MCV 93.3  78.0 - 100.0 fL   MCH 30.0  26.0 - 34.0 pg   MCHC 32.1  30.0 - 36.0 g/dL   RDW 13.3  11.5 - 15.5 %   Platelets 168  150 - 400 K/uL   Neutrophils Relative % 87 (*) 43 - 77 %   Neutro Abs 10.4 (*) 1.7 - 7.7 K/uL   Lymphocytes Relative 8 (*) 12 - 46 %   Lymphs Abs 0.9  0.7 - 4.0 K/uL   Monocytes Relative  5  3 - 12 %   Monocytes Absolute 0.6  0.1 - 1.0 K/uL   Eosinophils Relative 0  0 - 5 %   Eosinophils Absolute 0.0  0.0 - 0.7 K/uL   Basophils Relative 0  0 - 1 %   Basophils Absolute 0.0  0.0 - 0.1 K/uL  COMPREHENSIVE METABOLIC PANEL     Status: Abnormal   Collection Time    03/06/14 11:10 AM      Result Value Ref Range   Sodium 142  137 - 147 mEq/L   Potassium 4.2  3.7 - 5.3 mEq/L   Chloride 102  96 - 112 mEq/L   CO2 28  19 - 32 mEq/L   Glucose, Bld 106 (*) 70 - 99 mg/dL   BUN 33 (*) 6 - 23 mg/dL   Creatinine, Ser 0.53  0.50 - 1.10 mg/dL   Calcium 9.4  8.4 - 10.5 mg/dL   Total Protein 6.9  6.0 - 8.3 g/dL   Albumin 3.8  3.5 - 5.2 g/dL   AST 29  0 - 37 U/L   ALT 20  0 -  35 U/L   Alkaline Phosphatase 89  39 - 117 U/L   Total Bilirubin 0.4  0.3 - 1.2 mg/dL   GFR calc non Af Amer 79 (*) >90 mL/min   GFR calc Af Amer >90  >90 mL/min   Comment: (NOTE)     The eGFR has been calculated using the CKD EPI equation.     This calculation has not been validated in all clinical situations.     eGFR's persistently <90 mL/min signify possible Chronic Kidney     Disease.  URINALYSIS, ROUTINE W REFLEX MICROSCOPIC     Status: Abnormal   Collection Time    03/06/14 11:40 AM      Result Value Ref Range   Color, Urine YELLOW  YELLOW   APPearance CLOUDY (*) CLEAR   Specific Gravity, Urine 1.020  1.005 - 1.030   pH 7.0  5.0 - 8.0   Glucose, UA NEGATIVE  NEGATIVE mg/dL   Hgb urine dipstick NEGATIVE  NEGATIVE   Bilirubin Urine NEGATIVE  NEGATIVE   Ketones, ur 15 (*) NEGATIVE mg/dL   Protein, ur NEGATIVE  NEGATIVE mg/dL   Urobilinogen, UA 0.2  0.0 - 1.0 mg/dL   Nitrite POSITIVE (*) NEGATIVE   Leukocytes, UA NEGATIVE  NEGATIVE  URINE MICROSCOPIC-ADD ON     Status: Abnormal   Collection Time    03/06/14 11:40 AM      Result Value Ref Range   WBC, UA 3-6  <3 WBC/hpf   Bacteria, UA MANY (*) RARE   Dg Chest 2 View  03/06/2014   CLINICAL DATA:  Fall.  Posterior right shoulder pain.  EXAM: CHEST   2 VIEW  COMPARISON:  None.  FINDINGS: The heart is mildly enlarged. There is no edema or effusion to suggest failure. Emphysematous changes are noted. Arm positioning obscures the upper mediastinum. The visualized soft tissues and bony thorax are unremarkable.  IMPRESSION: 1. Borderline cardiomegaly without failure. 2. Emphysema.   Electronically Signed   By: Lawrence Santiago M.D.   On: 03/06/2014 11:59   Dg Shoulder Right  03/06/2014   CLINICAL DATA:  Fall.  Posterior right shoulder pain.  EXAM: RIGHT SHOULDER - 2+ VIEW  COMPARISON:  None.  FINDINGS: The right shoulder is located. Calcification is noted along the rotator cuff tendon. A nondisplaced right lateral eighth rib fracture is present. There is no pneumothorax.  IMPRESSION: 1. Calcification along the rotator cuff insertion is likely chronic. 2. Minimally displaced right lateral eighth rib fracture without pneumothorax.   Electronically Signed   By: Lawrence Santiago M.D.   On: 03/06/2014 12:02   Dg Hip Complete Right  03/06/2014   CLINICAL DATA:  FALL ARM PAIN CHEST PAIN  EXAM: RIGHT HIP - COMPLETE 2+ VIEW  COMPARISON:  None.  FINDINGS: There are areas of cortical irregularity along the lateral subcapital region of the right femoral neck. There is a compression of the trabecula markings along the subcapital region of the femoral neck. These findings most prominent on the frontal view.  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 Severe periarticular sclerosis is appreciated within the right hip.  The bones are osteopenic. Degenerative changes appreciated within the lower lumbar spine. Osteoarthritic changes are identified within the left hip though to a lesser extent. Atherosclerotic calcifications are appreciated.  IMPRESSION: Findings consistent with a subcapital femoral neck fracture on the right.  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  Osteoarthritic changes to a lesser extent appreciated within the left hip.  Spondylosis lower lumbar spine.   Electronically Signed   By: Margaree Mackintosh M.D.   On: 03/06/2014 12:05   Ct Head Wo Contrast  03/06/2014   CLINICAL DATA:  History of fall  EXAM: CT HEAD WITHOUT CONTRAST  CT CERVICAL SPINE WITHOUT CONTRAST  TECHNIQUE: Multidetector CT imaging of the head and cervical spine was performed following the standard protocol without intravenous contrast. Multiplanar CT image reconstructions of the cervical spine were also generated.  COMPARISON:  CT HEAD W/O CM dated 11/16/2013  FINDINGS: CT HEAD FINDINGS  No acute intracranial abnormality. Specifically, no hemorrhage, hydrocephalus, mass lesion, acute infarction, or significant intracranial injury. No acute calvarial abnormality. Stable global atrophy. Areas of low attenuation within the subcortical, deep, and periventricular white matter regions consistent with small vessel white matter ischaemia. The visualized paranasal sinuses and mastoid air cells are patent.  CT CERVICAL SPINE FINDINGS  There is no evidence of acute fracture nor dislocation. Areas of hypertrophic spurring along the dens, and peripheral endplates of C3 through C7. Multilevel disc space narrowing from the C3-4 through the C7-T1. These areas also demonstrate endplate sclerosis and regions of endplate cyst formation. Mild areas of anterolisthesis at C4-5 C5-6 and to a lesser extent C7-T1. Areas of facet sclerosis, hypertrophy and joint space narrowing in the mid cervical spine. There is no evidence of canal stenosis no neural foraminal narrowing.  There is levoscoliosis, and anterior tilt of the cervical spine. The anterior tilt is likely due to the increased thoracic kyphosis.  IMPRESSION: 1. No acute intracranial abnormality. Stable chronic and involutional changes. 2. Multilevel multifactorial spondylosis without acute osseous abnormalities. Areas of mild anterolisthesis are likely  secondary to degenerative change.   Electronically Signed   By: Margaree Mackintosh M.D.   On: 03/06/2014 13:40   Ct Chest W Contrast  03/06/2014   ADDENDUM REPORT: 03/06/2014 14:09  ADDENDUM: The original report was by Dr. Van Clines. The following addendum is by Dr. Van Clines:  I received a phone call with the question regarding the right proximal femur.  There is a rim of osteophyte formation on the right femoral neck which simulated fracture on the prior radiographs. CT does not reveal a definite fracture in this vicinity, I favor the appearance being due to the severe spurring and severe underlying arthropathy. If the patient is truly unable to bear weight enhance focal hip pain, MRI could prove useful for definitive assessment.   Electronically Signed   By: Sherryl Barters M.D.   On: 03/06/2014 14:09   03/06/2014   CLINICAL DATA:  The patient slipped and fall fell, landing on the right side, and has right lateral rib pain. Rib fractures on chest radiography.  EXAM: CT CHEST, ABDOMEN, AND PELVIS WITH CONTRAST  TECHNIQUE: Multidetector CT imaging of the chest, abdomen and pelvis was performed following the standard protocol during bolus administration of intravenous contrast.  CONTRAST:  56m OMNIPAQUE IOHEXOL 300 MG/ML  SOLN  COMPARISON:  03/06/2012  FINDINGS: CT CHEST FINDINGS  No pathologic thoracic adenopathy.  Small right pleural effusion noted with acute mildly displaced fracture of the right inferior scapula.  There are acute fractures of the the right-sided third through twelfth ribs, most of which are not nondisplaced, but several of which are mildly displaced. The tenth and eleventh rib fractures on the right are segmental.  Degenerative appearing lower cervical subluxations are partially included on today's exam. I do not observe a thoracic  spine compression fracture.  No pneumothorax is observed, although there is a tiny amount of gas along the right eleventh rib fracture posteriorly  which may represent nitrogen gas phenomenon. Mild biapical pleural parenchymal scarring. Subsegmental atelectasis noted in both lower lobes. There is degenerative sternoclavicular arthropathy bilaterally.  CT ABDOMEN AND PELVIS FINDINGS  If subtle hypodense lesions are present anteriorly in segment 4A of the liver somewhat atrophic upper left hepatic lobe noted, with linear lucency tracking between the medial and lateral segments, an with mild periportal scarring in the right hepatic lobe.  The spleen and pancreas appear normal.  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 No perihepatic ascites. Gallbladder unremarkable.  Several small hypodense lesions are present in the right kidney. Mild scarring in the left kidney. If  No pathologic upper abdominal adenopathy is observed. No pathologic pelvic adenopathy is observed.  Urinary bladder unremarkable. There is severe degenerative arthropathy of the hips, right greater than left, with flattening of the right femoral head, right hip effusion, exuberant spurring on the right. Calcifications are present along the joint capsules bilaterally.  Lumbar spondylosis and degenerative disc disease noted.  Uterine contour unremarkable.  IMPRESSION: 1. Acute fractures of the right-sided third through twelfth ribs. The tenth and eleventh rib fractures are segmental. 2. Small right pleural effusion. 3. Acute fracture the right inferior scapula. 4. Unusual appearance of the liver with suspected scarring/atrophy along portions of the left hepatic lobe and anterior right hepatic lobe, and mild intrahepatic biliary dilatation. I do not observe a discrete tumor and given the lack of perihepatic ascites I doubt that the areas of suspected scarring are actually due to hepatic lacerations. 5. 1 cm splenic artery aneurysm. 6. Spondylosis. 7. Severe bilateral hip arthropathy, right greater than left.  Electronically Signed: By: Sherryl Barters M.D. On: 03/06/2014 13:43   Ct Cervical  Spine Wo Contrast  03/06/2014   CLINICAL DATA:  History of fall  EXAM: CT HEAD WITHOUT CONTRAST  CT CERVICAL SPINE WITHOUT CONTRAST  TECHNIQUE: Multidetector CT imaging of the head and cervical spine was performed following the standard protocol without intravenous contrast. Multiplanar CT image reconstructions of the cervical spine were also generated.  COMPARISON:  CT HEAD W/O CM dated 11/16/2013  FINDINGS: CT HEAD FINDINGS  No acute intracranial abnormality. Specifically, no hemorrhage, hydrocephalus, mass lesion, acute infarction, or significant intracranial injury. No acute calvarial abnormality. Stable global atrophy. Areas of low attenuation within the subcortical, deep, and periventricular white matter regions consistent with small vessel white matter ischaemia. The visualized paranasal sinuses and mastoid air cells are patent.  CT CERVICAL SPINE FINDINGS  There is no evidence of acute fracture nor dislocation. Areas of hypertrophic spurring along the dens, and peripheral endplates of C3 through C7. Multilevel disc space narrowing from the C3-4 through the C7-T1. These areas also demonstrate endplate sclerosis and regions of endplate cyst formation. Mild areas of anterolisthesis at C4-5 C5-6 and to a lesser extent C7-T1. Areas of facet sclerosis, hypertrophy and joint space narrowing in the mid cervical spine. There is no evidence of canal stenosis no neural foraminal narrowing.  There is levoscoliosis, and anterior tilt of the cervical spine. The anterior tilt is likely due to the increased thoracic kyphosis.  IMPRESSION: 1. No acute intracranial abnormality. Stable chronic and involutional changes. 2. Multilevel multifactorial spondylosis without acute osseous abnormalities. Areas of mild anterolisthesis are likely secondary to degenerative change.   Electronically Signed   By: Margaree Mackintosh M.D.   On: 03/06/2014 13:40  Ct Abdomen Pelvis W Contrast  03/06/2014   ADDENDUM REPORT: 03/06/2014 14:09   ADDENDUM: The original report was by Dr. Van Clines. The following addendum is by Dr. Van Clines:  I received a phone call with the question regarding the right proximal femur.  There is a rim of osteophyte formation on the right femoral neck which simulated fracture on the prior radiographs. CT does not reveal a definite fracture in this vicinity, I favor the appearance being due to the severe spurring and severe underlying arthropathy. If the patient is truly unable to bear weight enhance focal hip pain, MRI could prove useful for definitive assessment.   Electronically Signed   By: Sherryl Barters M.D.   On: 03/06/2014 14:09   03/06/2014   CLINICAL DATA:  The patient slipped and fall fell, landing on the right side, and has right lateral rib pain. Rib fractures on chest radiography.  EXAM: CT CHEST, ABDOMEN, AND PELVIS WITH CONTRAST  TECHNIQUE: Multidetector CT imaging of the chest, abdomen and pelvis was performed following the standard protocol during bolus administration of intravenous contrast.  CONTRAST:  13m OMNIPAQUE IOHEXOL 300 MG/ML  SOLN  COMPARISON:  03/06/2012  FINDINGS: CT CHEST FINDINGS  No pathologic thoracic adenopathy.  Small right pleural effusion noted with acute mildly displaced fracture of the right inferior scapula.  There are acute fractures of the the right-sided third through twelfth ribs, most of which are not nondisplaced, but several of which are mildly displaced. The tenth and eleventh rib fractures on the right are segmental.  Degenerative appearing lower cervical subluxations are partially included on today's exam. I do not observe a thoracic spine compression fracture.  No pneumothorax is observed, although there is a tiny amount of gas along the right eleventh rib fracture posteriorly which may represent nitrogen gas phenomenon. Mild biapical pleural parenchymal scarring. Subsegmental atelectasis noted in both lower lobes. There is degenerative sternoclavicular  arthropathy bilaterally.  CT ABDOMEN AND PELVIS FINDINGS  If subtle hypodense lesions are present anteriorly in segment 4A of the liver somewhat atrophic upper left hepatic lobe noted, with linear lucency tracking between the medial and lateral segments, an with mild periportal scarring in the right hepatic lobe.  The spleen and pancreas appear normal.  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 No perihepatic ascites. Gallbladder unremarkable.  Several small hypodense lesions are present in the right kidney. Mild scarring in the left kidney. If  No pathologic upper abdominal adenopathy is observed. No pathologic pelvic adenopathy is observed.  Urinary bladder unremarkable. There is severe degenerative arthropathy of the hips, right greater than left, with flattening of the right femoral head, right hip effusion, exuberant spurring on the right. Calcifications are present along the joint capsules bilaterally.  Lumbar spondylosis and degenerative disc disease noted.  Uterine contour unremarkable.  IMPRESSION: 1. Acute fractures of the right-sided third through twelfth ribs. The tenth and eleventh rib fractures are segmental. 2. Small right pleural effusion. 3. Acute fracture the right inferior scapula. 4. Unusual appearance of the liver with suspected scarring/atrophy along portions of the left hepatic lobe and anterior right hepatic lobe, and mild intrahepatic biliary dilatation. I do not observe a discrete tumor and given the lack of perihepatic ascites I doubt that the areas of suspected scarring are actually due to hepatic lacerations. 5. 1 cm splenic artery aneurysm. 6. Spondylosis. 7. Severe bilateral hip arthropathy, right greater than left.  Electronically Signed: By: WSherryl BartersM.D. On: 03/06/2014 13:43    Review of  Systems  Cardiovascular: Positive for chest pain.    Blood pressure 132/64, pulse 75, temperature 97.9 F (36.6 C), temperature source Oral, resp. rate 20, SpO2 99.00%. Physical  Exam  Constitutional: She is oriented to person, place, and time. She appears well-developed.  cachectic  HENT:  Head: Normocephalic and atraumatic.  No apparent traumatic injury  Eyes: Conjunctivae and EOM are normal. Pupils are equal, round, and reactive to light.  Neck: Normal range of motion. Neck supple.  Cardiovascular: Normal rate, regular rhythm and normal heart sounds.   Respiratory: Effort normal and breath sounds normal. No respiratory distress. She has no wheezes. She has no rales. She exhibits bony tenderness (minimal bony tenderness). She exhibits no laceration, no crepitus, no deformity and no retraction.  Able to get IS up to 500 without immediate pain medication administration  GI: Soft. Bowel sounds are normal.  Musculoskeletal:       Right hip: She exhibits decreased range of motion and tenderness (mild). She exhibits no crepitus.  Neurological: She is alert and oriented to person, place, and time. She has normal reflexes.  Skin: Skin is warm and dry.  Psychiatric: She has a normal mood and affect. Her behavior is normal. Judgment and thought content normal.     Assessment/Plan Ground level fall with: Multiple right rib fractures and no pneumothorax. Right scapular fracture Possible right subcapital femur fracture  Will get CT of the right hip to confirm or disprove subcapital fracture. Orthopedic consultation for right scapular fracture Pain control Admit for observation, pain control, development of plan for placement.  Gwenyth Ober 03/06/2014, 4:46 PM   Procedures

## 2014-03-06 NOTE — ED Notes (Signed)
Pt from [facility]. Pt slipped and fell, landed on R side. Pt denies LOC, hitting head, head/neck pain or n/v. Pt states her R upper arm hurts, no deformity noted. Pt also complains of R lower lateral rib pain. Pt alert/oriented, strong radial pulse.

## 2014-03-06 NOTE — ED Notes (Signed)
Unsuccessful IV attempt x 2.  Notified Electrical engineer.

## 2014-03-06 NOTE — ED Notes (Signed)
MD at bedside. 

## 2014-03-07 ENCOUNTER — Encounter (HOSPITAL_COMMUNITY): Payer: Self-pay

## 2014-03-07 ENCOUNTER — Observation Stay (HOSPITAL_COMMUNITY): Payer: Medicare PPO

## 2014-03-07 DIAGNOSIS — W19XXXA Unspecified fall, initial encounter: Secondary | ICD-10-CM | POA: Diagnosis present

## 2014-03-07 DIAGNOSIS — S42101A Fracture of unspecified part of scapula, right shoulder, initial encounter for closed fracture: Secondary | ICD-10-CM | POA: Diagnosis present

## 2014-03-07 LAB — CBC
HCT: 40 % (ref 36.0–46.0)
HEMOGLOBIN: 13 g/dL (ref 12.0–15.0)
MCH: 31 pg (ref 26.0–34.0)
MCHC: 32.5 g/dL (ref 30.0–36.0)
MCV: 95.5 fL (ref 78.0–100.0)
Platelets: 159 10*3/uL (ref 150–400)
RBC: 4.19 MIL/uL (ref 3.87–5.11)
RDW: 13.5 % (ref 11.5–15.5)
WBC: 8.5 10*3/uL (ref 4.0–10.5)

## 2014-03-07 LAB — BASIC METABOLIC PANEL
BUN: 24 mg/dL — ABNORMAL HIGH (ref 6–23)
CHLORIDE: 103 meq/L (ref 96–112)
CO2: 27 meq/L (ref 19–32)
CREATININE: 0.57 mg/dL (ref 0.50–1.10)
Calcium: 8.9 mg/dL (ref 8.4–10.5)
GFR calc Af Amer: 89 mL/min — ABNORMAL LOW (ref >90)
GFR calc non Af Amer: 77 mL/min — ABNORMAL LOW (ref >90)
GLUCOSE: 103 mg/dL — AB (ref 70–99)
Potassium: 4.3 mEq/L (ref 3.7–5.3)
Sodium: 141 mEq/L (ref 137–147)

## 2014-03-07 MED ORDER — MORPHINE SULFATE 2 MG/ML IJ SOLN: 2.0000 mg | INTRAMUSCULAR | Status: DC | PRN

## 2014-03-07 MED ORDER — MELOXICAM 7.5 MG PO TABS
7.5000 mg | ORAL_TABLET | Freq: Every day | ORAL | Status: DC
  Administered 2014-03-07 – 2014-03-09 (×3): 7.5 mg via ORAL
  Filled 2014-03-07 (×3): qty 1

## 2014-03-07 MED ORDER — TRAMADOL HCL 50 MG PO TABS
50.0000 mg | ORAL_TABLET | Freq: Four times a day (QID) | ORAL | Status: DC | PRN
  Administered 2014-03-07 – 2014-03-08 (×3): 50 mg via ORAL
  Administered 2014-03-08 – 2014-03-09 (×4): 100 mg via ORAL
  Filled 2014-03-07: qty 1
  Filled 2014-03-07: qty 2
  Filled 2014-03-07 (×2): qty 1
  Filled 2014-03-07 (×3): qty 2

## 2014-03-07 NOTE — Evaluation (Signed)
Occupational Therapy Evaluation Patient Details Name: Lindsey Grimes MRN: [id number] DOB: Apr 07, 1917 Today's Date: 03/07/2014    History of Present Illness Pt with multiple rib fractures. 80 y.o. female who presents with mechanical fall at her ALF after turning in bathroom to put on clothes. Denies dizziness. She states she landed on her back, but hit her head. No LOC.  She complains of R sided chest pain, R arm pain, R hip pain. She has chronic R sided weakness from MS, but states it is no different.   Clinical Impression   Pt demonstrates decline in function and safety with ADLs and ADL mobility with decreased strength, balance and endurance. Pt would benefit from acute OT services to address impairments to increase level of function and safety    Follow Up Recommendations  SNF;Supervision/Assistance - 24 hour    Equipment Recommendations  None recommended by OT;Other (comment) (TBD at next venue of care)    Recommendations for Other Services       Precautions / Restrictions Precautions Precautions: Fall Precaution Comments: rib fractures Required Braces or Orthoses: Other Brace/Splint Other Brace/Splint: R bulit up shoe and orthotic/AFO Restrictions Weight Bearing Restrictions: No      Mobility Bed Mobility Overal bed mobility: Needs Assistance Bed Mobility: Supine to Sit;Sit to Supine     Supine to sit: Min assist;+2 for physical assistance Sit to supine: Min assist;+2 for physical assistance   General bed mobility comments: using rails and pads to sit EOB  Transfers Overall transfer level: Needs assistance Equipment used: Rolling walker (2 wheeled) Transfers: Sit to/from Stand Sit to Stand: Min assist;+2 physical assistance         General transfer comment: cues for correct hand placement, safety. Pt moves at very slow pace and required increased time    Balance Overall balance assessment: Needs assistance Sitting-balance support: Single extremity  supported;Bilateral upper extremity supported;Feet supported Sitting balance-Leahy Scale: Poor     Standing balance support: Bilateral upper extremity supported;During functional activity Standing balance-Leahy Scale: Poor                              ADL Overall ADL's : Needs assistance/impaired     Grooming: Wash/dry hands;Sitting;Minimal assistance Grooming Details (indicate cue type and reason): mod A for balance/suppport Upper Body Bathing: Minimal assitance Upper Body Bathing Details (indicate cue type and reason): mod A for balance/suppport Lower Body Bathing: Total assistance   Upper Body Dressing : Minimal assistance;Sitting Upper Body Dressing Details (indicate cue type and reason): mod A for balance/suppport Lower Body Dressing: Total assistance   Toilet Transfer: Minimal assistance;+2 for physical assistance;RW Toilet Transfer Details (indicate cue type and reason): Sat on bed pan at EOB for unrination that began as soon as pt stood up at [employer] Toileting- ArchitectClothing Manipulation and Hygiene: Total assistance       Functional mobility during ADLs: Minimal assistance;+2 for safety/equipment;Cueing for safety       Vision  wears glasses, cataracts                              Pertinent Vitals/Pain 8/10 R rib areas per pt report. VSS, O2 SATs 97% before activity, 93% after activity     Hand Dominance Right   Extremity/Trunk Assessment Upper Extremity Assessment Upper Extremity Assessment: Generalized weakness   Lower Extremity Assessment Lower Extremity Assessment: Defer to PT evaluation   Cervical / Trunk  Assessment Cervical / Trunk Assessment: Kyphotic   Communication Communication Communication: No difficulties   Cognition Arousal/Alertness: Awake/alert Behavior During Therapy: WFL for tasks assessed/performed Overall Cognitive Status: Within Functional Limits for tasks assessed                     General Comments   Pt  pleasant and cooperative, her daughter is very supportive                 Home Living Family/patient expects to be discharged to:: Assisted living                             Home Equipment: Shower seat;Grab bars - tub/shower;Wheelchair - Careers adviser (comment)   Additional Comments: uses 3W RW to get arounf facility, uses w/c to go to activities      Prior Functioning/Environment Level of Independence: Independent with assistive device(s)        Comments: uses 3W RW to get around facility, uses w/c to go to activities. Independent with ADLs most days per pt and her daughter's report    OT Diagnosis: Generalized weakness;Disturbance of vision   OT Problem List: Decreased strength;Decreased knowledge of use of DME or AE;Decreased activity tolerance;Pain;Decreased safety awareness;Impaired balance (sitting and/or standing)   OT Treatment/Interventions: Self-care/ADL training;Therapeutic exercise;Patient/family education;Neuromuscular education;Balance training;Therapeutic activities    OT Goals(Current goals can be found in the care plan section) Acute Rehab OT Goals Patient Stated Goal: to get well and go home OT Goal Formulation: With patient/family Time For Goal Achievement: 03/14/14 Potential to Achieve Goals: Good ADL Goals Pt Will Perform Grooming: with min assist;standing Pt Will Perform Upper Body Bathing: with min guard assist;with supervision;with set-up Pt Will Perform Lower Body Bathing: with max assist;with mod assist;sitting/lateral leans;sit to/from stand Pt Will Perform Upper Body Dressing: with min guard assist;with supervision;with set-up Pt Will Transfer to Toilet: with min assist;bedside commode;ambulating;grab bars;regular height toilet (3 in 1 over toilet) Pt Will Perform Toileting - Clothing Manipulation and hygiene: with max assist;with mod assist;sitting/lateral leans;sit to/from stand  OT Frequency: Min 2X/week   Barriers to D/C:  Decreased caregiver support          Co-evaluation PT/OT/SLP Co-Evaluation/Treatment: Yes Reason for Co-Treatment: For patient/therapist safety   OT goals addressed during session: ADL's and self-care      End of Session Equipment Utilized During Treatment: Gait belt;Rolling walker  Activity Tolerance: Patient limited by fatigue;Patient limited by pain Patient left: in chair;with call bell/phone within reach;with family/visitor present   Time: 2774-1287 OT Time Calculation (min): 34 min Charges:  OT General Charges $OT Visit: 1 Procedure OT Evaluation $Initial OT Evaluation Tier I: 1 Procedure OT Treatments $Therapeutic Activity: 8-22 mins G-Codes: OT G-codes **NOT FOR INPATIENT CLASS** Functional Limitation: Self care Self Care Current Status (O6767): At least 60 percent but less than 80 percent impaired, limited or restricted Self Care Goal Status (M0947): At least 80 percent but less than 100 percent impaired, limited or restricted  Lafe Garin 03/07/2014, 2:20 PM

## 2014-03-07 NOTE — Evaluation (Signed)
Physical Therapy Evaluation Patient Details Name: Lindsey Grimes MRN: [id number] DOB: October 30, 1917 Today's Date: 03/07/2014   History of Present Illness  80 [facility].o. [facility] with fall and multiple right rib fractures, R scapular fx, negative right hip fracture.  Pt with significant PMhx of DDD and MS (with right lower leg weakness requiring a special shoe and AFO).    Clinical Impression  Pt is moving gingerly as she is very sore on her right side.  Some of the daily activities she preforms on her own now require assist.  She would benefit from SNF placement for rehab before returning to ALF.   PT to follow acutely for deficits listed below.       Follow Up Recommendations SNF    Equipment Recommendations  None recommended by PT    Recommendations for Other Services   None    Precautions / Restrictions Precautions Precautions: Fall Precaution Comments: h/o falls at home, gentle with gait belt due to rib fxs.  Required Braces or Orthoses: Other Brace/Splint Other Brace/Splint: special R shoe and R AFO.  Pt is WBAT R arm, no sling needed, no ROM restrictions.  Restrictions Weight Bearing Restrictions: No RUE Weight Bearing: Weight bearing as tolerated      Mobility  Bed Mobility Overal bed mobility: Needs Assistance Bed Mobility: Supine to Sit     Supine to sit: Min assist Sit to supine: Min assist   General bed mobility comments: using rails and pads to sit EOB  Transfers Overall transfer level: Needs assistance Equipment used: Rolling walker (2 wheeled) Transfers: Sit to/from Stand Sit to Stand: +2 physical assistance;Min assist         General transfer comment: cues for correct hand placement, safety. Pt moves at very slow pace and required increased time.  She needs to have her feet under her before standing and is worried about the slick floor.  Incontinent of urine upon standing (she normally wears depends).  Assist needed to control ascent and  descent at trunk.   Ambulation/Gait Ambulation/Gait assistance: Min assist;+2 safety/equipment Ambulation Distance (Feet): 20 Feet Assistive device: Rolling walker (2 wheeled) Gait Pattern/deviations: Step-through pattern;Shuffle;Decreased dorsiflexion - right;Decreased step length - right;Trunk flexed Gait velocity: decreased Gait velocity interpretation: <1.8 ft/sec, indicative of risk for recurrent falls General Gait Details: gait speed significantly slower than 1.8 ft/sec putting her at high risk for falls.  She is very flexed and can barely look up to see where she is going due to her thoracic kyphosis and cervical spine forward flexion. She does have difficulty progressing her right leg, but does not require much physical assitance once she gets moving.          Balance Overall balance assessment: Needs assistance Sitting-balance support: Feet supported;No upper extremity supported Sitting balance-Leahy Scale: Poor Sitting balance - Comments: posterior lean in sitting, especially when asked to lift a foot to donn her shoes and brace   Standing balance support: Bilateral upper extremity supported Standing balance-Leahy Scale: Poor Standing balance comment: needs bil upper and min trunk support to stand                             Pertinent Vitals/Pain See vitals flow sheet. O2 stable on RA, O2 left off with pt in the chair.  IS use- pt only able to get 600mL max with 5  Reps.      Home Living Family/patient expects to be  discharged to:: Assisted living (from ALF)               Home Equipment: Shower seat;Grab bars - tub/shower;Wheelchair - Careers adviser (comment) Additional Comments: uses 3W RW to get arounf facility, uses w/c to go to activities    Prior Function Level of Independence: Independent with assistive device(s)         Comments: uses 3W RW to get around facility, uses w/c to go to activities. Independent with ADLs most days per pt and her  daughter's report.  Does have an aid who comes by to help her shower a few times per week, but she normally gets herself dressed.      Hand Dominance   Dominant Hand: Right    Extremity/Trunk Assessment   Upper Extremity Assessment: Defer to OT evaluation           Lower Extremity Assessment: RLE deficits/detail;LLE deficits/detail RLE Deficits / Details: right leg was weak at baseline functionally, the pt has a hard time lifting it against gravity to progress it EOB without using bil upper extremities.  Weak LAQ and difficulty progressing it during gait as well. AFO and shoe donned for her total assist EOB. Per daughter the pt is usually able to do it herself, but it takes a very long time.  LLE Deficits / Details: generalized weakness.   Cervical / Trunk Assessment: Kyphotic;Other exceptions  Communication   Communication: No difficulties  Cognition Arousal/Alertness: Awake/alert Behavior During Therapy: WFL for tasks assessed/performed Overall Cognitive Status: Within Functional Limits for tasks assessed                               Assessment/Plan    PT Assessment Patient needs continued PT services  PT Diagnosis Difficulty walking;Abnormality of gait;Generalized weakness;Acute pain   PT Problem List Decreased strength;Decreased activity tolerance;Decreased balance;Decreased mobility;Decreased knowledge of use of DME;Decreased knowledge of precautions;Pain  PT Treatment Interventions DME instruction;Gait training;Functional mobility training;Therapeutic activities;Therapeutic exercise;Balance training;Patient/family education;Neuromuscular re-education;Modalities   PT Goals (Current goals can be found in the Care Plan section) Acute Rehab PT Goals Patient Stated Goal: to get well and go home PT Goal Formulation: With patient/family Time For Goal Achievement: 03/21/14 Potential to Achieve Goals: Good    Frequency Min 2X/week   Barriers to discharge  Decreased caregiver support pt lives at ALF and does not have 24/7 help    Co-evaluation PT/OT/SLP Co-Evaluation/Treatment: Yes Reason for Co-Treatment: Complexity of the patient's impairments (multi-system involvement);For patient/therapist safety PT goals addressed during session: Mobility/safety with mobility;Balance;Proper use of DME;Strengthening/ROM OT goals addressed during session: ADL's and self-care       End of Session Equipment Utilized During Treatment: Gait belt (down low to avoid painful ribs) Activity Tolerance: Patient limited by pain Patient left: in chair;with call bell/phone within reach;with family/visitor present Nurse Communication: Mobility status;Other (comment) (please pass along in report OOB to chair this weeken)         Time: 1206-1250 PT Time Calculation (min): 44 min   Charges:   PT Evaluation $Initial PT Evaluation Tier I: 1 Procedure PT Treatments $Therapeutic Activity: 8-22 mins        Lindsey Grimes, PT, DPT [phone]   03/07/2014, 2:35 PM

## 2014-03-07 NOTE — Clinical Social Work Placement (Addendum)
Clinical Social Work Department CLINICAL SOCIAL WORK PLACEMENT NOTE 03/07/2014  Patient:  JAILEY, BISSEN  Account Number:  [id number] Admit date:  03/06/2014  Clinical Social Worker:  Macario Golds, LCSW  Date/time:  03/07/2014 04:00 PM  Clinical Social Work is seeking post-discharge placement for this patient at the following level of care:   SKILLED NURSING   (*CSW will update this form in Epic as items are completed)   03/07/2014  Patient/family provided with [facility] Department of Clinical Social Work's list of facilities offering this level of care within the geographic area requested by the patient (or if unable, by the patient's family).  03/07/2014  Patient/family informed of their freedom to choose among providers that offer the needed level of care, that participate in Medicare, Medicaid or managed care program needed by the patient, have an available bed and are willing to accept the patient.  03/07/2014  Patient/family informed of [facility]' ownership interest in [facility], as well as of the fact that they are under no obligation to receive care at this facility.  PASARR submitted to EDS on 03/07/2014 PASARR number received from EDS on 03/07/2014  FL2 transmitted to all facilities in geographic area requested by pt/family on  03/07/2014 FL2 transmitted to all facilities within larger geographic area on   Patient informed that his/her managed care company has contracts with or will negotiate with  certain facilities, including the following:     Patient/family informed of bed offers received: 03/09/14   Patient chooses bed at [facility] SN[facility] Physician recommends and patient chooses bed at    Patient to be transferred to [facility] SNF on  03/09/14  Patient to be transferred to facility by [facility]  The following physician request were entered in Epic:   Additional Comments:

## 2014-03-07 NOTE — Progress Notes (Signed)
PT/OT. Ortho eval. I spoke with her daughter. [facility] does not have a skilled level section. May need CIR vs SNF. Patient examined and I agree with the assessment and plan  Violeta GelinasBurke Annaelle Kasel, MD, MPH, FACS Trauma: [phone] General Surgery: [phone]  03/07/2014 10:55 AM

## 2014-03-07 NOTE — Progress Notes (Signed)
Patient ID: Lindsey Grimes, female   DOB: 1917-11-06, 80 y.o.   MRN: [id number]   LOS: 1 day   Subjective: No unexpected c/o.   Objective: Vital signs in last 24 hours: Temp:  [97.5 F (36.4 C)-98.6 F (37 C)] 97.9 F (36.6 C) (04/24 0540) Pulse Rate:  [57-77] 77 (04/24 0540) Resp:  [12-29] 16 (04/24 0540) BP: (117-150)/(45-66) 147/66 mmHg (04/24 0540) SpO2:  [91 %-100 %] 96 % (04/24 0540) Weight:  [120 lb (54.432 kg)] 120 lb (54.432 kg) (04/23 1813) Last BM Date: 03/05/14   IS:   Laboratory  CBC  Recent Labs  03/06/14 1110 03/07/14 0327  WBC 12.0* 8.5  HGB 13.5 13.0  HCT 42.0 40.0  PLT 168 159   BMET  Recent Labs  03/06/14 1110 03/07/14 0327  NA 142 141  K 4.2 4.3  CL 102 103  CO2 28 27  GLUCOSE 106* 103*  BUN 33* 24*  CREATININE 0.53 0.57  CALCIUM 9.4 8.9    Radiology Results CXR: No PTX, right basilar ATX (Official read pending)   Physical Exam General appearance: alert and no distress Resp: clear to auscultation bilaterally Cardio: regular rate and rhythm GI: normal findings: bowel sounds normal and soft, non-tender   Assessment/Plan: Fall Multiple right rib fxs -- Pulmonary toilet Right scapula fx -- Handy to consult Multiple medical problems -- Home meds FEN -- Non-narcotic for pain VTE -- SCD's, Lovenox Dispo -- PT/OT    Freeman Caldron, PA-C Pager: [phone] General Trauma PA Pager: [phone]  03/07/2014

## 2014-03-07 NOTE — ED Provider Notes (Signed)
I saw and evaluated the patient, reviewed the resident's note and I agree with the findings and plan.   EKG Interpretation   Date/Time:  Thursday March 06 2014 09:35:09 EDT Ventricular Rate:  62 PR Interval:  189 QRS Duration: 84 QT Interval:  459 QTC Calculation: 466 R Axis:   36 Text Interpretation:  Sinus rhythm Low voltage, precordial leads Artifact  no prior for comparison Confirmed by DOCHERTY  MD, MEGAN ([id number]) on  03/06/2014 9:42:02 AM        Dagmar Hait, MD 03/07/14 1556

## 2014-03-07 NOTE — Consult Note (Signed)
Orthopaedic Trauma Service Consult  Requesting: Trauma service Reason: Closed right scapular body fracture status post fall  Patient seen and evaluated See dictation: [id number]  Assessment and plan  80 year old female status post fall  1. Fall  2. Closed right scapular body fracture  Conservative management  Symptomatic care  No formal restrictions for right upper extremity  Range of motion as tolerated, weight-bear as tolerated  PT and OT  No sling necessary  3. multiple sclerosis  Patient has an AFO and custom shoe that she is to wear   Make sure that these are when working with therapy  4. Disposition  Continue per trauma service  Followup with orthopedics in 3-4 weeks  Mearl LatinKeith W. Halil Rentz, PA-C [facility] [phone] (P) 03/07/2014 1:24 PM

## 2014-03-07 NOTE — Clinical Social Work Note (Signed)
Clinical Social Work Department BRIEF PSYCHOSOCIAL ASSESSMENT 03/07/2014  Patient:  Lindsey Grimes, Lindsey Grimes     Account Number:  [id number]     Admit date:  03/06/2014  Clinical Social Worker:  Myles Lipps  Date/Time:  03/07/2014 04:00 PM  Referred by:  Physician  Date Referred:  03/07/2014 Referred for  SNF Placement   Other Referral:   Interview type:  Patient Other interview type:   Patient daughter at bedside and able to provide collateral information    PSYCHOSOCIAL DATA Living Status:  FACILITY Admitted from facility:  [facility] Level of care:  Assisted Living Primary support name:  Penny Pia  [phone] Primary support relationship to patient:  CHILD, ADULT Degree of support available:   Strong    CURRENT CONCERNS Current Concerns  Post-Acute Placement   Other Concerns:    SOCIAL WORK ASSESSMENT / PLAN Clinical Social Worker met with patient and patient daughter at bedside to offer support and discuss patient needs at discharge.  Patient and patient daughter both state that patient is from [facility] and would love to return at discharge but are realistic concerning patient needs.  Patient and patient daughter both agreeable to SNF search in [place].  CSW to complete FL2, initiate search, and provide patient and patient daughter with bed offers once available.  Patient daughter plans to communicate with [facility] regarding placement and patient continued stay following rehab.  Clinical Social Worker remains available for support and to facilitate patient discharge plans once medically ready.   Assessment/plan status:  Psychosocial Support/Ongoing Assessment of Needs Other assessment/ plan:   Information/referral to community resources:   Holiday representative offered facility list - patient daughter agreeable to facility list once bed offers are available.  Patient daughter open to additional resources as needed.    No SBIRT completed at this  time, due to patient inability to remain awake.    PATIENT'S/FAMILY'S RESPONSE TO PLAN OF CARE: Patient alert and oriented x3 laying in bed, intermittently falling asleep during assessment.  Patient daughter very engaged in patient care and did an Advertising account executive job relaying information to patient in a way that patient understood. Patient and patient daughter agreeable with current plan for SNF prior to return to [facility].  Patient daughter verbalized her appreciation for CSW support and involvement.

## 2014-03-08 LAB — URINE CULTURE: Colony Count: >=100000

## 2014-03-08 LAB — MRSA PCR SCREENING: MRSA BY PCR: NEGATIVE

## 2014-03-08 LAB — GLUCOSE, CAPILLARY: GLUCOSE-CAPILLARY: 116 mg/dL — AB (ref 70–99)

## 2014-03-08 MED ORDER — CEPHALEXIN 500 MG PO CAPS
500.0000 mg | ORAL_CAPSULE | Freq: Two times a day (BID) | ORAL | Status: DC
Start: 2014-03-08 — End: 2014-03-09
  Administered 2014-03-08 – 2014-03-09 (×3): 500 mg via ORAL
  Filled 2014-03-08 (×4): qty 1

## 2014-03-08 NOTE — Progress Notes (Signed)
I have seen and examined the patient and agree with the assessment and plans.  Marc Leichter A. Simone Tuckey  MD, FACS  

## 2014-03-08 NOTE — Consult Note (Signed)
NAMMarland Kitchen:  Lindsey Grimes, Lindsey Grimes                 ACCOUNT NO.:  [id number]  MEDICAL RECORD NO.:  [id number]  LOCATION:  [id number]                        FACILITY:  [facility]  PHYSICIAN:  Doralee AlbinoMichael H. Carola FrostHandy, M.D. DATE OF BIRTH:  [birth year]  DATE OF CONSULTATION:  03/07/2014 DATE OF DISCHARGE:                                CONSULTATION   REQUESTING:  Trauma service.  REASON FOR CONSULTATION:  Closed right scapular body fracture status post fall.  HISTORY OF PRESENT ILLNESS:  Lindsey Grimes is a very pleasant 80 year old female who lives at an assisted living facility, who unfortunately sustained a fall yesterday afternoon.  The patient was attempting to get dressed when she sustained a ground level fall.  [facility] and [facility].  She was admitted to the Trauma service.  Orthopedics was consulted when CT scan demonstrated a right scapular body fracture.  Currently, the patient is in room 6 North 26, she is accompanied by her daughter.  She does not have any complaints in particular other than some right-sided rib pain. Finding it somewhat difficult to take deep breaths.  No other complaints.  She denies any numbness or tingling in her right upper extremity.  She is quite independent individual.  She lives in an assisted living facility and is quite active with extracurricular activities that they have for them at the facility.  PAST MEDICAL HISTORY:  Hyperlipidemia, degenerative disk disease, multiple sclerosis, chronic nasal congestion, anxiety, and chronic constipation.  SURGICAL HISTORY:  None.  FAMILY HISTORY:  Noncontributory.  SOCIAL HISTORY:  The patient lives at an assisted living facility.  She does not smoke and does not drink.  MEDICATIONS:  Prior to admission include Tylenol, vitamin D, Colace, Flonase, Synthroid, magnesium oxide, Mobic, multivitamin, omega-3, potassium chloride, psyllium, and Crestor.  REVIEW OF SYSTEMS:  As noted  above in the HPI.  PHYSICAL EXAMINATION:  VITAL SIGNS:  Temperature 98.2, heart rate 75, respirations 18, 100% on room air, and BP is 108/41. GENERAL:  The patient is awake, alert, very pleasant 80 year old female, appears to be fairly comfortable in bed. EXTREMITIES:  Right upper extremity examination is fairly benign.  No significant tenderness to palpation over scapula.  Nontender with palpation of clavicle, shoulder, humeral shaft, elbow, forearm, wrist, or hand.  No significant swelling or bruising is appreciated.  Radial, ulnar, median, and axillary nerve motor and sensory functions are grossly intact.  Palpable radial pulse appreciated.  Extremities warm. No pain with active motion of her shoulder, elbow, forearm, wrist, or hand.  No pain with passive motion as well.  IMAGING:  CAT scan of the chest demonstrates a comminuted right scapular body fracture, with no extension into the glenoid.  ASSESSMENT:  A 80 year old female status post fall. 1. Fall. 2. Closed right scapular body fracture in right-hand dominant female.     We will manage her conservatively, symptomatic care, no formal     restrictions for the right upper extremity.  She is permitted to     weight bear through this arm as needed, range of motion as     tolerated to the right upper extremity as well.  We  will obtain OT     and PT consult.  No sling is necessary as well. 3. Multiple sclerosis.  The patient has an AFO in a custom shoe that     she will need to wear when working with therapy. 4. Disposition:  Continue per Trauma service.  Follow up with     Orthopedics in 3-4 weeks.     Mearl Latin, PA-C   ______________________________ Doralee Albino. Carola Frost, M.D.    KWP/MEDQ  D:  03/07/2014  T:  03/08/2014  Job:  [id number]

## 2014-03-08 NOTE — Progress Notes (Signed)
Patient ID: Lindsey Grimes, female   DOB: 10/18/1917, 80 y.o.   MRN: [id number]  LOS: 2 days   Subjective: Pain well controlled.  No sob.  Sitting up in eating breakfast.   Objective: Vital signs in last 24 hours: Temp:  [97.4 F (36.3 C)-98.2 F (36.8 C)] 98.2 F (36.8 C) (04/25 0634) Pulse Rate:  [66-82] 77 (04/25 0634) Resp:  [16-20] 18 (04/25 0634) BP: (95-135)/(34-58) 135/53 mmHg (04/25 0634) SpO2:  [91 %-100 %] 93 % (04/25 0634) Last BM Date: 03/05/14  Lab Results:  CBC  Recent Labs  03/06/14 1110 03/07/14 0327  WBC 12.0* 8.5  HGB 13.5 13.0  HCT 42.0 40.0  PLT 168 159   BMET  Recent Labs  03/06/14 1110 03/07/14 0327  NA 142 141  K 4.2 4.3  CL 102 103  CO2 28 27  GLUCOSE 106* 103*  BUN 33* 24*  CREATININE 0.53 0.57  CALCIUM 9.4 8.9    Imaging: Dg Chest 2 View  03/07/2014   CLINICAL DATA:  Shortness of breath  EXAM: CHEST  2 VIEW  COMPARISON:  03/06/2014  FINDINGS: Cardiac shadow is stable. Diffuse interstitial changes are again identified. Increased density is noted in the right lung base which projects in the right lower lobe on the lateral film. Extrinsic artifact is noted overlying the left upper lobe. No bony abnormality is seen. Marland Kitchen  IMPRESSION: Increasing right basilar infiltrate.   Electronically Signed   By: Alcide Clever M.D.   On: 03/07/2014 07:50   Dg Chest 2 View  03/06/2014   CLINICAL DATA:  Fall.  Posterior right shoulder pain.  EXAM: CHEST  2 VIEW  COMPARISON:  None.  FINDINGS: The heart is mildly enlarged. There is no edema or effusion to suggest failure. Emphysematous changes are noted. Arm positioning obscures the upper mediastinum. The visualized soft tissues and bony thorax are unremarkable.  IMPRESSION: 1. Borderline cardiomegaly without failure. 2. Emphysema.   Electronically Signed   By: Gennette Pac M.D.   On: 03/06/2014 11:59   Dg Shoulder Right  03/06/2014   CLINICAL DATA:  Fall.  Posterior right shoulder pain.  EXAM: RIGHT SHOULDER -  2+ VIEW  COMPARISON:  None.  FINDINGS: The right shoulder is located. Calcification is noted along the rotator cuff tendon. A nondisplaced right lateral eighth rib fracture is present. There is no pneumothorax.  IMPRESSION: 1. Calcification along the rotator cuff insertion is likely chronic. 2. Minimally displaced right lateral eighth rib fracture without pneumothorax.   Electronically Signed   By: Gennette Pac M.D.   On: 03/06/2014 12:02   Dg Hip Complete Right  03/06/2014   CLINICAL DATA:  FALL ARM PAIN CHEST PAIN  EXAM: RIGHT HIP - COMPLETE 2+ VIEW  COMPARISON:  None.  FINDINGS: There are areas of cortical irregularity along the lateral subcapital region of the right femoral neck. There is a compression of the trabecula markings along the subcapital region of the femoral neck. These findings most prominent on the frontal view.  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 Severe periarticular sclerosis is appreciated within the right hip.  The bones are osteopenic. Degenerative changes appreciated within the lower lumbar spine. Osteoarthritic changes are identified within the left hip though to a lesser extent. Atherosclerotic calcifications are appreciated.  IMPRESSION: Findings consistent with a subcapital femoral neck fracture on the right.  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 Osteoarthritic  changes to a lesser extent appreciated within the left hip.  Spondylosis lower lumbar spine.   Electronically Signed   By: Salome Holmes M.D.   On: 03/06/2014 12:05   Ct Head Wo Contrast  03/06/2014   CLINICAL DATA:  History of fall  EXAM: CT HEAD WITHOUT CONTRAST  CT CERVICAL SPINE WITHOUT CONTRAST  TECHNIQUE: Multidetector CT imaging of the head and cervical spine was performed following the standard protocol without intravenous contrast. Multiplanar CT image reconstructions of the cervical spine  were also generated.  COMPARISON:  CT HEAD W/O CM dated 11/16/2013  FINDINGS: CT HEAD FINDINGS  No acute intracranial abnormality. Specifically, no hemorrhage, hydrocephalus, mass lesion, acute infarction, or significant intracranial injury. No acute calvarial abnormality. Stable global atrophy. Areas of low attenuation within the subcortical, deep, and periventricular white matter regions consistent with small vessel white matter ischaemia. The visualized paranasal sinuses and mastoid air cells are patent.  CT CERVICAL SPINE FINDINGS  There is no evidence of acute fracture nor dislocation. Areas of hypertrophic spurring along the dens, and peripheral endplates of C3 through C7. Multilevel disc space narrowing from the C3-4 through the C7-T1. These areas also demonstrate endplate sclerosis and regions of endplate cyst formation. Mild areas of anterolisthesis at C4-5 C5-6 and to a lesser extent C7-T1. Areas of facet sclerosis, hypertrophy and joint space narrowing in the mid cervical spine. There is no evidence of canal stenosis no neural foraminal narrowing.  There is levoscoliosis, and anterior tilt of the cervical spine. The anterior tilt is likely due to the increased thoracic kyphosis.  IMPRESSION: 1. No acute intracranial abnormality. Stable chronic and involutional changes. 2. Multilevel multifactorial spondylosis without acute osseous abnormalities. Areas of mild anterolisthesis are likely secondary to degenerative change.   Electronically Signed   By: Salome Holmes M.D.   On: 03/06/2014 13:40   Ct Chest W Contrast  03/06/2014   ADDENDUM REPORT: 03/06/2014 14:09  ADDENDUM: The original report was by Dr. Gaylyn Rong. The following addendum is by Dr. Gaylyn Rong:  I received a phone call with the question regarding the right proximal femur.  There is a rim of osteophyte formation on the right femoral neck which simulated fracture on the prior radiographs. CT does not reveal a definite fracture in  this vicinity, I favor the appearance being due to the severe spurring and severe underlying arthropathy. If the patient is truly unable to bear weight enhance focal hip pain, MRI could prove useful for definitive assessment.   Electronically Signed   By: Herbie Baltimore M.D.   On: 03/06/2014 14:09   03/06/2014   CLINICAL DATA:  The patient slipped and fall fell, landing on the right side, and has right lateral rib pain. Rib fractures on chest radiography.  EXAM: CT CHEST, ABDOMEN, AND PELVIS WITH CONTRAST  TECHNIQUE: Multidetector CT imaging of the chest, abdomen and pelvis was performed following the standard protocol during bolus administration of intravenous contrast.  CONTRAST:  80mL OMNIPAQUE IOHEXOL 300 MG/ML  SOLN  COMPARISON:  03/06/2012  FINDINGS: CT CHEST FINDINGS  No pathologic thoracic adenopathy.  Small right pleural effusion noted with acute mildly displaced fracture of the right inferior scapula.  There are acute fractures of the the right-sided third through twelfth ribs, most of which are not nondisplaced, but several of which are mildly displaced. The tenth and eleventh rib fractures on the right are segmental.  Degenerative appearing lower cervical subluxations are partially included on today's exam. I do not observe a thoracic  spine compression fracture.  No pneumothorax is observed, although there is a tiny amount of gas along the right eleventh rib fracture posteriorly which may represent nitrogen gas phenomenon. Mild biapical pleural parenchymal scarring. Subsegmental atelectasis noted in both lower lobes. There is degenerative sternoclavicular arthropathy bilaterally.  CT ABDOMEN AND PELVIS FINDINGS  If subtle hypodense lesions are present anteriorly in segment 4A of the liver somewhat atrophic upper left hepatic lobe noted, with linear lucency tracking between the medial and lateral segments, an with mild periportal scarring in the right hepatic lobe.  The spleen and pancreas appear  normal.  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 No perihepatic ascites. Gallbladder unremarkable.  Several small hypodense lesions are present in the right kidney. Mild scarring in the left kidney. If  No pathologic upper abdominal adenopathy is observed. No pathologic pelvic adenopathy is observed.  Urinary bladder unremarkable. There is severe degenerative arthropathy of the hips, right greater than left, with flattening of the right femoral head, right hip effusion, exuberant spurring on the right. Calcifications are present along the joint capsules bilaterally.  Lumbar spondylosis and degenerative disc disease noted.  Uterine contour unremarkable.  IMPRESSION: 1. Acute fractures of the right-sided third through twelfth ribs. The tenth and eleventh rib fractures are segmental. 2. Small right pleural effusion. 3. Acute fracture the right inferior scapula. 4. Unusual appearance of the liver with suspected scarring/atrophy along portions of the left hepatic lobe and anterior right hepatic lobe, and mild intrahepatic biliary dilatation. I do not observe a discrete tumor and given the lack of perihepatic ascites I doubt that the areas of suspected scarring are actually due to hepatic lacerations. 5. 1 cm splenic artery aneurysm. 6. Spondylosis. 7. Severe bilateral hip arthropathy, right greater than left.  Electronically Signed: By: Herbie Baltimore M.D. On: 03/06/2014 13:43   Ct Cervical Spine Wo Contrast  03/06/2014   CLINICAL DATA:  History of fall  EXAM: CT HEAD WITHOUT CONTRAST  CT CERVICAL SPINE WITHOUT CONTRAST  TECHNIQUE: Multidetector CT imaging of the head and cervical spine was performed following the standard protocol without intravenous contrast. Multiplanar CT image reconstructions of the cervical spine were also generated.  COMPARISON:  CT HEAD W/O CM dated 11/16/2013  FINDINGS: CT HEAD FINDINGS  No acute intracranial abnormality. Specifically, no hemorrhage, hydrocephalus, mass lesion, acute  infarction, or significant intracranial injury. No acute calvarial abnormality. Stable global atrophy. Areas of low attenuation within the subcortical, deep, and periventricular white matter regions consistent with small vessel white matter ischaemia. The visualized paranasal sinuses and mastoid air cells are patent.  CT CERVICAL SPINE FINDINGS  There is no evidence of acute fracture nor dislocation. Areas of hypertrophic spurring along the dens, and peripheral endplates of C3 through C7. Multilevel disc space narrowing from the C3-4 through the C7-T1. These areas also demonstrate endplate sclerosis and regions of endplate cyst formation. Mild areas of anterolisthesis at C4-5 C5-6 and to a lesser extent C7-T1. Areas of facet sclerosis, hypertrophy and joint space narrowing in the mid cervical spine. There is no evidence of canal stenosis no neural foraminal narrowing.  There is levoscoliosis, and anterior tilt of the cervical spine. The anterior tilt is likely due to the increased thoracic kyphosis.  IMPRESSION: 1. No acute intracranial abnormality. Stable chronic and involutional changes. 2. Multilevel multifactorial spondylosis without acute osseous abnormalities. Areas of mild anterolisthesis are likely secondary to degenerative change.   Electronically Signed   By: Salome Holmes M.D.   On: 03/06/2014 13:40  Carlynn HeraldRedge GainerBurundiJohnson City Medical Cente1.61940- osephsrodir LLCEpifanio Lesches65quierodirigir.comHenreitta Leber Laurell Josephs7mCarlynn HeraldRedge GainerBurundi [phone] HospitalEpifanio Lesches71quierodirigir.comHenreitta Leber Laurell Josephs53mCarlynn HeraldRedge GainerBurundi [phone] HospitalEpifanio Lesches49quierodirigir.comHenreitta Leber Laurell Josephs55mCarlynn HeraldRedge GainerBurundi Barista(506) [phone] LLCEpifanio Lesches53quierodirigir.comHenreitta Leber Laurell Josephs42mCarlynn HeraldRedge GainerBurundi Barista([phone] Hospital-BirminghamEpifanio Lesches28quierodirigir.comHenreitta Leber Laurell Josephs69mCarlynn HeraldRedge GainerBurundi [phone] CenterEpifanio Lesches30quierodirigir.comHenreitta Leber Laurell Josephs32mCarlynn HeraldRedge GainerBurundi [phone]oscopy Asc LLCEpifanio Lesches  Wo Contrast  03/06/2014   CLINICAL DATA:  Fall  EXAM: CT OF THE RIGHT HIP WITHOUT CONTRAST  TECHNIQUE: Multidetector CT imaging was performed according to the standard protocol. Multiplanar CT image reconstructions were also generated.  COMPARISON:  None.  FINDINGS: Severe degenerative changes of the hip joint with bone on bone and juxta-articular sclerosis. Lateral subluxation is degenerative. No fracture or dislocation. Hip joint effusion is present.  IMPRESSION: Advanced degenerative change of the right hip joint. No acute fracture or dislocation.   Electronically Signed   By: Maryclare BeanArt  Hoss M.D.   On: 03/06/2014 21:30     PE: General appearance: alert, cooperative and no distress Resp: clear to  auscultation bilaterally Cardio: regular rate and rhythm, S1, S2 normal, no murmur, click, rub or gallop GI: soft, non-tender; bowel sounds normal; no masses,  no organomegaly Extremities: extremities normal, atraumatic, no cyanosis or edema    Patient Active Problem List   Diagnosis Date Noted  . Fall 03/07/2014  . Right scapula fracture 03/07/2014  . Multiple rib fractures 03/06/2014   Assessment/Plan:  Fall  Multiple right rib fxs -- Pulmonary toilet  Right scapula fx -- no formal restrictions per Dr. Carola FrostHandy.  Weight bear through this arm, ROM as tolerated. Multiple medical problems -- Home meds  FEN -- Non-narcotic for pain  VTE -- SCD's, Lovenox  Dispo -- SNF when bed is available, likely Monday    Ashok Norrismina Wilmary Levit, ANP-BC Pager: [phone] General Trauma [place] Pager: [phone]   03/08/2014  9:27 AM

## 2014-03-09 DIAGNOSIS — N390 Urinary tract infection, site not specified: Secondary | ICD-10-CM

## 2014-03-09 MED ORDER — CEPHALEXIN 500 MG PO CAPS: 500.0000 mg | ORAL_CAPSULE | Freq: Two times a day (BID) | ORAL | Status: AC

## 2014-03-09 MED ORDER — TRAMADOL HCL 50 MG PO TABS: 50.0000 mg | ORAL_TABLET | Freq: Four times a day (QID) | ORAL | Status: DC | PRN

## 2014-03-09 NOTE — Discharge Summary (Signed)
Physician Discharge Summary  Lindsey Grimes:[id number] DOB: 1917-02-08 DOA: 03/06/2014  PCP: Audree Bane  Consultation: Orthopedics--Dr. Myrene Galas  Admit date: 03/06/2014 Discharge date: 03/09/2014  Recommendations for Outpatient Follow-up:   Follow-up Information   Follow up with [facility]. (As needed)    Contact information:   [address] [place] [address] [phone]       Follow up with Audree Bane.   Specialty:  Family Medicine   Contact information:   [address] [place] [address] [phone]       Follow up with Budd Palmer, MD. (3-4 weeks)    Specialty:  Orthopedic Surgery   Contact information:   [address] [place] [address] [phone]      Discharge Diagnoses:  1. Fall 2. Multiple right sided rib fracture 3. Right scapula fracture 4. UTI   Surgical Procedure: none  Discharge Condition: stable Disposition: SNF  Diet recommendation: regular  Filed Weights   03/06/14 1813  Weight: 120 lb (54.432 kg)    Filed Vitals:   03/09/14 0943  BP: 112/58  Pulse: 75  Temp: 98.6 F (37 C)  Resp: 22     Hospital Course:  Lindsey Grimes is a 80 year old female with a history of MS, DDD, anxiety, HLD and chronic constipation who presented to [facility] following a GLF.  She was found to have multiple right sided rib fractures, right scapular fracture.  Orthopedics was consulted who recommend conservatively management, without any formal restrictions.  She is permitted to bear weight through the right arm as needed, range of motion as tolerated.  She will need to follow up with Dr. Carola Frost in 3-4 weeks.  Pulmonary toilet and pain control initiated for the rib fractures.  We minimized her narcotics and pain was well controlled with tramadol.  She was kept on her home medication.  PT and OT worked with the patient who recommended SNF placement.  She was found to have a UTI that was pan  sensitive and keflex was started.  On HD#3 the patients pain was well controlled, she was ambulating with assistance, VSS and therefore felt stable for discharge to SNF.     Discharge Instructions     Medication List         acetaminophen 500 MG tablet  Commonly known as:  TYLENOL  Take 500 mg by mouth 2 (two) times daily as needed for moderate pain.     cephALEXin 500 MG capsule  Commonly known as:  KEFLEX  Take 1 capsule (500 mg total) by mouth every 12 (twelve) hours.     cholecalciferol 1000 UNITS tablet  Commonly known as:  VITAMIN D  Take 1,000 Units by mouth daily.     docusate sodium 100 MG capsule  Commonly known as:  COLACE  Take 100 mg by mouth 2 (two) times daily.     fluticasone 50 MCG/ACT nasal spray  Commonly known as:  FLONASE  Place 1 spray into both nostrils 2 (two) times daily as needed for allergies.     ILEVRO 0.3 % Susp  Generic drug:  Nepafenac  Place 1 drop into the right eye daily. Use for 4 weeks     levothyroxine 25 MCG tablet  Commonly known as:  SYNTHROID, LEVOTHROID  Take 25 mcg by mouth daily before breakfast.     magnesium oxide 400 MG tablet  Commonly known as:  MAG-OX  Take 400 mg by  mouth at bedtime.     meloxicam 7.5 MG tablet  Commonly known as:  MOBIC  Take 7.5 mg by mouth daily as needed for pain.     multivitamin with minerals Tabs tablet  Take 1 tablet by mouth daily.     omega-3 acid ethyl esters 1 G capsule  Commonly known as:  LOVAZA  Take 1 g by mouth daily.     potassium chloride 10 MEQ tablet  Commonly known as:  K-DUR,KLOR-CON  Take 10 mEq by mouth daily.     psyllium 95 % Pack  Commonly known as:  HYDROCIL/METAMUCIL  Take 1 packet by mouth 2 (two) times daily.     rosuvastatin 5 MG tablet  Commonly known as:  CRESTOR  Take 5 mg by mouth at bedtime.     SYSTANE 0.4-0.3 % Soln  Generic drug:  Polyethyl Glycol-Propyl Glycol  Place 1 drop into both eyes at bedtime as needed.     traMADol 50 MG tablet   Commonly known as:  ULTRAM  Take 1 tablet (50 mg total) by mouth every 6 (six) hours as needed (50mg  for mild pain, 75mg  for moderate pain, 100mg  for severe pain).           Follow-up Information   Follow up with [facility]. (As needed)    Contact information:   [address] [place] [address] [phone]       Follow up with Audree Bane.   Specialty:  Family Medicine   Contact information:   [address] [place] [address] [phone]       Follow up with Budd Palmer, MD. (3-4 weeks)    Specialty:  Orthopedic Surgery   Contact information:   [address] [place] [address] [phone]        The results of significant diagnostics from this hospitalization (including imaging, microbiology, ancillary and laboratory) are listed below for reference.    Significant Diagnostic Studies: Dg Chest 2 View  03/07/2014   CLINICAL DATA:  Shortness of breath  EXAM: CHEST  2 VIEW  COMPARISON:  03/06/2014  FINDINGS: Cardiac shadow is stable. Diffuse interstitial changes are again identified. Increased density is noted in the right lung base which projects in the right lower lobe on the lateral film. Extrinsic artifact is noted overlying the left upper lobe. No bony abnormality is seen. Marland Kitchen  IMPRESSION: Increasing right basilar infiltrate.   Electronically Signed   By: Alcide Clever M.D.   On: 03/07/2014 07:50   Dg Chest 2 View  03/06/2014   CLINICAL DATA:  Fall.  Posterior right shoulder pain.  EXAM: CHEST  2 VIEW  COMPARISON:  None.  FINDINGS: The heart is mildly enlarged. There is no edema or effusion to suggest failure. Emphysematous changes are noted. Arm positioning obscures the upper mediastinum. The visualized soft tissues and bony thorax are unremarkable.  IMPRESSION: 1. Borderline cardiomegaly without failure. 2. Emphysema.   Electronically Signed   By: Gennette Pac M.D.   On: 03/06/2014 11:59   Dg Shoulder  Right  03/06/2014   CLINICAL DATA:  Fall.  Posterior right shoulder pain.  EXAM: RIGHT SHOULDER - 2+ VIEW  COMPARISON:  None.  FINDINGS: The right shoulder is located. Calcification is noted along the rotator cuff tendon. A nondisplaced right lateral eighth rib fracture is present. There is no pneumothorax.  IMPRESSION: 1. Calcification along the rotator cuff insertion is likely chronic. 2. Minimally displaced  right lateral eighth rib fracture without pneumothorax.   Electronically Signed   By: Gennette Pac M.D.   On: 03/06/2014 12:02   Dg Hip Complete Right  03/06/2014   CLINICAL DATA:  FALL ARM PAIN CHEST PAIN  EXAM: RIGHT HIP - COMPLETE 2+ VIEW  COMPARISON:  None.  FINDINGS: There are areas of cortical irregularity along the lateral subcapital region of the right femoral neck. There is a compression of the trabecula markings along the subcapital region of the femoral neck. These findings most prominent on the frontal view.  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 Severe periarticular sclerosis is appreciated within the right hip.  The bones are osteopenic. Degenerative changes appreciated within the lower lumbar spine. Osteoarthritic changes are identified within the left hip though to a lesser extent. Atherosclerotic calcifications are appreciated.  IMPRESSION: Findings consistent with a subcapital femoral neck fracture on the right.  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 Osteoarthritic changes to a lesser extent appreciated within the left hip.  Spondylosis lower lumbar spine.   Electronically Signed   By: Salome Holmes M.D.   On: 03/06/2014 12:05   Ct Head Wo Contrast  03/06/2014   CLINICAL DATA:  History of fall  EXAM: CT HEAD WITHOUT CONTRAST  CT CERVICAL SPINE WITHOUT CONTRAST  TECHNIQUE: Multidetector CT imaging of the head and cervical spine was performed following the  standard protocol without intravenous contrast. Multiplanar CT image reconstructions of the cervical spine were also generated.  COMPARISON:  CT HEAD W/O CM dated 11/16/2013  FINDINGS: CT HEAD FINDINGS  No acute intracranial abnormality. Specifically, no hemorrhage, hydrocephalus, mass lesion, acute infarction, or significant intracranial injury. No acute calvarial abnormality. Stable global atrophy. Areas of low attenuation within the subcortical, deep, and periventricular white matter regions consistent with small vessel white matter ischaemia. The visualized paranasal sinuses and mastoid air cells are patent.  CT CERVICAL SPINE FINDINGS  There is no evidence of acute fracture nor dislocation. Areas of hypertrophic spurring along the dens, and peripheral endplates of C3 through C7. Multilevel disc space narrowing from the C3-4 through the C7-T1. These areas also demonstrate endplate sclerosis and regions of endplate cyst formation. Mild areas of anterolisthesis at C4-5 C5-6 and to a lesser extent C7-T1. Areas of facet sclerosis, hypertrophy and joint space narrowing in the mid cervical spine. There is no evidence of canal stenosis no neural foraminal narrowing.  There is levoscoliosis, and anterior tilt of the cervical spine. The anterior tilt is likely due to the increased thoracic kyphosis.  IMPRESSION: 1. No acute intracranial abnormality. Stable chronic and involutional changes. 2. Multilevel multifactorial spondylosis without acute osseous abnormalities. Areas of mild anterolisthesis are likely secondary to degenerative change.   Electronically Signed   By: Salome Holmes M.D.   On: 03/06/2014 13:40   Ct Chest W Contrast  03/06/2014   ADDENDUM REPORT: 03/06/2014 14:09  ADDENDUM: The original report was by Dr. Gaylyn Rong. The following addendum is by Dr. Gaylyn Rong:  I received a phone call with the question regarding the right proximal femur.  There is a rim of osteophyte formation on the right  femoral neck which simulated fracture on the prior radiographs. CT does not reveal a definite fracture in this vicinity, I favor the appearance being due to the severe spurring and severe underlying arthropathy. If the patient is truly unable to bear weight enhance focal hip pain, MRI could  prove useful for definitive assessment.   Electronically Signed   By: Herbie Baltimore M.D.   On: 03/06/2014 14:09   03/06/2014   CLINICAL DATA:  The patient slipped and fall fell, landing on the right side, and has right lateral rib pain. Rib fractures on chest radiography.  EXAM: CT CHEST, ABDOMEN, AND PELVIS WITH CONTRAST  TECHNIQUE: Multidetector CT imaging of the chest, abdomen and pelvis was performed following the standard protocol during bolus administration of intravenous contrast.  CONTRAST:  80mL OMNIPAQUE IOHEXOL 300 MG/ML  SOLN  COMPARISON:  03/06/2012  FINDINGS: CT CHEST FINDINGS  No pathologic thoracic adenopathy.  Small right pleural effusion noted with acute mildly displaced fracture of the right inferior scapula.  There are acute fractures of the the right-sided third through twelfth ribs, most of which are not nondisplaced, but several of which are mildly displaced. The tenth and eleventh rib fractures on the right are segmental.  Degenerative appearing lower cervical subluxations are partially included on today's exam. I do not observe a thoracic spine compression fracture.  No pneumothorax is observed, although there is a tiny amount of gas along the right eleventh rib fracture posteriorly which may represent nitrogen gas phenomenon. Mild biapical pleural parenchymal scarring. Subsegmental atelectasis noted in both lower lobes. There is degenerative sternoclavicular arthropathy bilaterally.  CT ABDOMEN AND PELVIS FINDINGS  If subtle hypodense lesions are present anteriorly in segment 4A of the liver somewhat atrophic upper left hepatic lobe noted, with linear lucency tracking between the medial and lateral  segments, an with mild periportal scarring in the right hepatic lobe.  The spleen and pancreas appear normal.  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 No perihepatic ascites. Gallbladder unremarkable.  Several small hypodense lesions are present in the right kidney. Mild scarring in the left kidney. If  No pathologic upper abdominal adenopathy is observed. No pathologic pelvic adenopathy is observed.  Urinary bladder unremarkable. There is severe degenerative arthropathy of the hips, right greater than left, with flattening of the right femoral head, right hip effusion, exuberant spurring on the right. Calcifications are present along the joint capsules bilaterally.  Lumbar spondylosis and degenerative disc disease noted.  Uterine contour unremarkable.  IMPRESSION: 1. Acute fractures of the right-sided third through twelfth ribs. The tenth and eleventh rib fractures are segmental. 2. Small right pleural effusion. 3. Acute fracture the right inferior scapula. 4. Unusual appearance of the liver with suspected scarring/atrophy along portions of the left hepatic lobe and anterior right hepatic lobe, and mild intrahepatic biliary dilatation. I do not observe a discrete tumor and given the lack of perihepatic ascites I doubt that the areas of suspected scarring are actually due to hepatic lacerations. 5. 1 cm splenic artery aneurysm. 6. Spondylosis. 7. Severe bilateral hip arthropathy, right greater than left.  Electronically Signed: By: Herbie Baltimore M.D. On: 03/06/2014 13:43   Ct Cervical Spine Wo Contrast  03/06/2014   CLINICAL DATA:  History of fall  EXAM: CT HEAD WITHOUT CONTRAST  CT CERVICAL SPINE WITHOUT CONTRAST  TECHNIQUE: Multidetector CT imaging of the head and cervical spine was performed following the standard protocol without intravenous contrast. Multiplanar CT image reconstructions of the cervical spine were also generated.  COMPARISON:  CT HEAD W/O CM dated 11/16/2013  FINDINGS: CT HEAD FINDINGS   No acute intracranial abnormality. Specifically, no hemorrhage, hydrocephalus, mass lesion, acute infarction, or significant intracranial injury. No acute calvarial abnormality. Stable global atrophy. Areas of low attenuation within the subcortical, deep, and periventricular white  matter regions consistent with small vessel white matter ischaemia. The visualized paranasal sinuses and mastoid air cells are patent.  CT CERVICAL SPINE FINDINGS  There is no evidence of acute fracture nor dislocation. Areas of hypertrophic spurring along the dens, and peripheral endplates of C3 through C7. Multilevel disc space narrowing from the C3-4 through the C7-T1. These areas also demonstrate endplate sclerosis and regions of endplate cyst formation. Mild areas of anterolisthesis at C4-5 C5-6 and to a lesser extent C7-T1. Areas of facet sclerosis, hypertrophy and joint space narrowing in the mid cervical spine. There is no evidence of canal stenosis no neural foraminal narrowing.  There is levoscoliosis, and anterior tilt of the cervical spine. The anterior tilt is likely due to the increased thoracic kyphosis.  IMPRESSION: 1. No acute intracranial abnormality. Stable chronic and involutional changes. 2. Multilevel multifactorial spondylosis without acute osseous abnormalities. Areas of mild anterolisthesis are likely secondary to degenerative change.   Electronically Signed   By: Salome HolmesHector  Cooper M.D.   On: 03/06/2014 13:40   Ct Abdomen Pelvis W Contrast  03/06/2014   ADDENDUM REPORT: 03/06/2014 14:09  ADDENDUM: The original report was by Dr. Gaylyn RongWalter Liebkemann. The following addendum is by Dr. Gaylyn RongWalter Liebkemann:  I received a phone call with the question regarding the right proximal femur.  There is a rim of osteophyte formation on the right femoral neck which simulated fracture on the prior radiographs. CT does not reveal a definite fracture in this vicinity, I favor the appearance being due to the severe spurring and severe  underlying arthropathy. If the patient is truly unable to bear weight enhance focal hip pain, MRI could prove useful for definitive assessment.   Electronically Signed   By: Herbie BaltimoreWalt  Liebkemann M.D.   On: 03/06/2014 14:09   03/06/2014   CLINICAL DATA:  The patient slipped and fall fell, landing on the right side, and has right lateral rib pain. Rib fractures on chest radiography.  EXAM: CT CHEST, ABDOMEN, AND PELVIS WITH CONTRAST  TECHNIQUE: Multidetector CT imaging of the chest, abdomen and pelvis was performed following the standard protocol during bolus administration of intravenous contrast.  CONTRAST:  80mL OMNIPAQUE IOHEXOL 300 MG/ML  SOLN  COMPARISON:  03/06/2012  FINDINGS: CT CHEST FINDINGS  No pathologic thoracic adenopathy.  Small right pleural effusion noted with acute mildly displaced fracture of the right inferior scapula.  There are acute fractures of the the right-sided third through twelfth ribs, most of which are not nondisplaced, but several of which are mildly displaced. The tenth and eleventh rib fractures on the right are segmental.  Degenerative appearing lower cervical subluxations are partially included on today's exam. I do not observe a thoracic spine compression fracture.  No pneumothorax is observed, although there is a tiny amount of gas along the right eleventh rib fracture posteriorly which may represent nitrogen gas phenomenon. Mild biapical pleural parenchymal scarring. Subsegmental atelectasis noted in both lower lobes. There is degenerative sternoclavicular arthropathy bilaterally.  CT ABDOMEN AND PELVIS FINDINGS  If subtle hypodense lesions are present anteriorly in segment 4A of the liver somewhat atrophic upper left hepatic lobe noted, with linear lucency tracking between the medial and lateral segments, an with mild periportal scarring in the right hepatic lobe.  The spleen and pancreas appear normal.  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 No perihepatic ascites.  Gallbladder unremarkable.  Several small hypodense lesions are present in the right kidney. Mild scarring in the left kidney. If  No pathologic upper abdominal adenopathy  is observed. No pathologic pelvic adenopathy is observed.  Urinary bladder unremarkable. There is severe degenerative arthropathy of the hips, right greater than left, with flattening of the right femoral head, right hip effusion, exuberant spurring on the right. Calcifications are present along the joint capsules bilaterally.  Lumbar spondylosis and degenerative disc disease noted.  Uterine contour unremarkable.  IMPRESSION: 1. Acute fractures of the right-sided third through twelfth ribs. The tenth and eleventh rib fractures are segmental. 2. Small right pleural effusion. 3. Acute fracture the right inferior scapula. 4. Unusual appearance of the liver with suspected scarring/atrophy along portions of the left hepatic lobe and anterior right hepatic lobe, and mild intrahepatic biliary dilatation. I do not observe a discrete tumor and given the lack of perihepatic ascites I doubt that the areas of suspected scarring are actually due to hepatic lacerations. 5. 1 cm splenic artery aneurysm. 6. Spondylosis. 7. Severe bilateral hip arthropathy, right greater than left.  Electronically Signed: By: Herbie Baltimore M.D. On: 03/06/2014 13:43   Ct Hip Right Wo Contrast  03/06/2014   CLINICAL DATA:  Fall  EXAM: CT OF THE RIGHT HIP WITHOUT CONTRAST  TECHNIQUE: Multidetector CT imaging was performed according to the standard protocol. Multiplanar CT image reconstructions were also generated.  COMPARISON:  None.  FINDINGS: Severe degenerative changes of the hip joint with bone on bone and juxta-articular sclerosis. Lateral subluxation is degenerative. No fracture or dislocation. Hip joint effusion is present.  IMPRESSION: Advanced degenerative change of the right hip joint. No acute fracture or dislocation.   Electronically Signed   By: Maryclare Bean M.D.   On:  03/06/2014 21:30    Microbiology: Recent Results (from the past 240 hour(s))  URINE CULTURE     Status: None   Collection Time    03/06/14 11:40 AM      Result Value Ref Range Status   Specimen Description URINE, CATHETERIZED   Final   Special Requests NONE   Final   Culture  Setup Time     Final   Value: 03/06/2014 15:07     Performed at [facility] Count     Final   Value: >=100,000 COLONIES/ML     Performed at [facility]   Culture     Final   Value: ESCHERICHIA COLI     Performed at [facility]   Report Status 03/08/2014 FINAL   Final   Organism ID, Bacteria ESCHERICHIA COLI   Final  MRSA PCR SCREENING     Status: None   Collection Time    03/08/14  6:57 AM      Result Value Ref Range Status   MRSA by PCR NEGATIVE  NEGATIVE Final   Comment:            The GeneXpert MRSA Assay (FDA     approved for NASAL specimens     only), is one component of a     comprehensive MRSA colonization     surveillance program. It is not     intended to diagnose MRSA     infection nor to guide or     monitor treatment for     MRSA infections.     Labs: Basic Metabolic Panel:  Recent Labs Lab 03/06/14 1110 03/07/14 0327  NA 142 141  K 4.2 4.3  CL 102 103  CO2 28 27  GLUCOSE 106* 103*  BUN 33* 24*  CREATININE 0.53 0.57  CALCIUM 9.4 8.9  Liver Function Tests:  Recent Labs Lab 03/06/14 1110  AST 29  ALT 20  ALKPHOS 89  BILITOT 0.4  PROT 6.9  ALBUMIN 3.8   No results found for this basename: LIPASE, AMYLASE,  in the last 168 hours No results found for this basename: AMMONIA,  in the last 168 hours CBC:  Recent Labs Lab 03/06/14 1110 03/07/14 0327  WBC 12.0* 8.5  NEUTROABS 10.4*  --   HGB 13.5 13.0  HCT 42.0 40.0  MCV 93.3 95.5  PLT 168 159   Cardiac Enzymes: No results found for this basename: CKTOTAL, CKMB, CKMBINDEX, TROPONINI,  in the last 168 hours BNP: BNP (last 3 results) No results found for this basename:  PROBNP,  in the last 8760 hours CBG:  Recent Labs Lab 03/08/14 1731  GLUCAP 116*    Active Problems:   Multiple rib fractures   Fall   Right scapula fracture   Time coordinating discharge: <30 mins  Signed:  Tarrie Mcmichen, ANP-BC

## 2014-03-09 NOTE — Discharge Summary (Signed)
Tan Clopper, MD, MPH, FACS Trauma: [phone] General Surgery: [phone]  

## 2014-03-09 NOTE — Progress Notes (Signed)
Agree - hope to go to SNF today. I spoke with CSW. Violeta GelinasBurke Anistyn Graddy, MD, MPH, FACS Trauma: [phone] General Surgery: [phone]

## 2014-03-09 NOTE — Discharge Summary (Addendum)
Patient discharge to [facility] facility by transport. Report called to Burnett Harry, RN at facility

## 2014-03-09 NOTE — Discharge Instructions (Signed)
Right scapula fracture-no restrictions needed.  Right rib fractures-aggressive pulmonary toilet, avoid narcotics, mobilize

## 2014-03-09 NOTE — Progress Notes (Signed)
Weekend CSW informed that patient medically stable for transfer to SNF. CSW contacted all facilities that have accepted patient, [facility] is agreeable to accept patient today. CSW updated patient and daughter Avon Gully) Tennis Ship, both are agreeable. CSW offered support and information on transition process to patient and daughter. Daughter agreeable to complete paperwork at facility, requests [facility] transport.   Weekend CSW updated RN and NP, and sent facility discharge summary.  CSW will continue to follow to assist patient transition to [facility].  Samuella Bruin, MSW, LCSWA Clinical Social Worker [facility] Emergency Dept. [phone]

## 2014-03-09 NOTE — Progress Notes (Signed)
Weekend CSW spoke with Admissions Coordinator Toniann Fail at [facility] who requested that patient be transported around 2 pm, as patient's daughter Tennis Ship is coming to the hospital to visit patient before transport. CSW contacted [facility] afterhours [phone] and requested transportation at 2 pm.   SNF packet on chart. CSW signing off, please re-consult if further social work needs arise.   Samuella Bruin, MSW, LCSWA Clinical Social Worker [facility] Emergency Dept. [phone]

## 2014-03-09 NOTE — Progress Notes (Signed)
Patient ID: Lindsey Grimes, female   DOB: 11-Jan-1917, 80 y.o.   MRN: [id number]  LOS: 3 days   Subjective: Up to 500ml on IS.  Up with nursing.  Pain well controlled.  Denies sob.   Objective: Vital signs in last 24 hours: Temp:  [97.8 F (36.6 C)-99.1 F (37.3 C)] 98.3 F (36.8 C) (04/26 0635) Pulse Rate:  [70-81] 81 (04/26 0635) Resp:  [16-26] 26 (04/26 0635) BP: (125-154)/(44-74) 153/55 mmHg (04/26 0635) SpO2:  [91 %-93 %] 91 % (04/26 0635) Last BM Date: 03/06/14  Lab Results:  CBC  Recent Labs  03/06/14 1110 03/07/14 0327  WBC 12.0* 8.5  HGB 13.5 13.0  HCT 42.0 40.0  PLT 168 159   BMET  Recent Labs  03/06/14 1110 03/07/14 0327  NA 142 141  K 4.2 4.3  CL 102 103  CO2 28 27  GLUCOSE 106* 103*  BUN 33* 24*  CREATININE 0.53 0.57  CALCIUM 9.4 8.9   PE:  General appearance: alert, cooperative and no distress  Resp: clear to auscultation bilaterally  Cardio: regular rate and rhythm, S1, S2 normal, no murmur, click, rub or gallop  GI: soft, non-tender; bowel sounds normal; no masses, no organomegaly  Extremities: extremities normal, atraumatic, no cyanosis or edema     Patient Active Problem List   Diagnosis Date Noted  . Fall 03/07/2014  . Right scapula fracture 03/07/2014  . Multiple rib fractures 03/06/2014   Assessment/Plan:  Fall  Multiple right rib fxs -- Pulmonary toilet  Right scapula fx -- no formal restrictions per Dr. Carola FrostHandy. Weight bear through this arm, ROM as tolerated.  Multiple medical problems -- Home meds  UTI-pan sensitive keflex D#2 FEN -- Non-narcotic for pain  VTE -- SCD's, Lovenox  Dispo -- SNF when bed is available, likely Monday    Ashok Norrismina Mckynzi Cammon, ANP-BC Pager: [phone] General Trauma PA Pager: [phone]   03/09/2014 8:49 AM

## 2014-03-19 ENCOUNTER — Other Ambulatory Visit: Payer: Self-pay | Admitting: *Deleted

## 2014-04-02 NOTE — Consult Note (Signed)
I have reviewed and discussed in detail with Mr. Paul the patient's presentation, examination findings, and I formulated the plan outlined above.  Sheyanne Munley, MD [facility] [phone] (p)   

## 2014-04-02 NOTE — Consult Note (Signed)
Please see my note on full dictation.  Juvia Aerts, MD [facility] [phone] (p)  

## 2015-06-12 ENCOUNTER — Other Ambulatory Visit: Payer: Self-pay | Admitting: Orthopedic Surgery

## 2015-06-12 DIAGNOSIS — M25551 Pain in right hip: Secondary | ICD-10-CM

## 2015-06-18 ENCOUNTER — Inpatient Hospital Stay: Admission: RE | Admit: 2015-06-18 | Payer: Medicare PPO | Source: Ambulatory Visit

## 2017-05-04 ENCOUNTER — Other Ambulatory Visit (HOSPITAL_COMMUNITY): Payer: Self-pay | Admitting: Family Medicine

## 2017-05-04 DIAGNOSIS — R1319 Other dysphagia: Secondary | ICD-10-CM

## 2017-05-22 ENCOUNTER — Ambulatory Visit (HOSPITAL_COMMUNITY)
Admission: RE | Admit: 2017-05-22 | Discharge: 2017-05-22 | Disposition: A | Discharge status: Home / Self Care | Payer: Medicare PPO | Source: Ambulatory Visit | Attending: Family Medicine | Admitting: Family Medicine

## 2017-05-22 DIAGNOSIS — R1319 Other dysphagia: Secondary | ICD-10-CM | POA: Insufficient documentation

## 2017-05-22 NOTE — Progress Notes (Signed)
Modified Barium Swallow Progress Note  Patient Details  Name: Lindsey Grimes MRN: [id number] Date of Birth: 1917-03-04  Today's Date: 05/22/2017  Modified Barium Swallow completed.  Full report located under Chart Review in the Imaging Section.  Brief recommendations include the following:  Clinical Impression  Pt has a cervical esophageal dysphagia with a prominent CP segment that impedes bolus flow with all consistencies. Backflow into the pharynx is observed that increases with more solid textures. Intermittent silent penetration occurs with thin liquids, which mostly clears with a cued cough. No penetration occurs with nectar thick liquids. In general, the liquids assist in clearing the cervical esophagus, but with multiple boluses needed. Pt does remain at risk for aspiration given the above, however I think the risk can be mitigated with use of small bites, thorough mastication, good positioning, and use of liquid washes. Although no penetration was observed with nectar thick liquids, she would still be at risk for post-prandial aspiration given the residuals, and long-term use of thickened liquids is not recommended for pts with esophageal issues. This, along with the fact that she says she does not drink much liquids at baseline, I would also be concerned for dehydration and potential complications from that. Therefore, given the above and that pt has not been developing PNA, she may benefit from continued use of thin liquids taking small, single cup sips and utilizing a throat clear/cough post-swallow. Recommend additional SLP f/u at her ALF to maximize safety and monitor for tolerance.   Swallow Evaluation Recommendations       SLP Diet Recommendations: Regular solids;Thin liquid   Liquid Administration via: Cup;No straw   Medication Administration: Crushed with puree   Supervision: Patient able to self feed;Comment (close supervision)   Compensations: Slow rate;Small  sips/bites;Follow solids with liquid;Multiple dry swallows after each bite/sip;Hard cough after swallow   Postural Changes: Remain semi-upright after after feeds/meals (Comment);Seated upright at 90 degrees   Oral Care Recommendations: Oral care QID        Maxcine Ham 05/22/2017,1:04 PM   Maxcine Ham, M.A. CCC-SLP [phone]

## 2017-07-07 ENCOUNTER — Emergency Department (HOSPITAL_COMMUNITY)
Admission: EM | Admit: 2017-07-07 | Discharge: 2017-07-08 | Disposition: A | Discharge status: Home / Self Care | Payer: Medicare PPO | Attending: Emergency Medicine | Admitting: Emergency Medicine

## 2017-07-07 ENCOUNTER — Encounter (HOSPITAL_COMMUNITY): Payer: Self-pay

## 2017-07-07 ENCOUNTER — Emergency Department (HOSPITAL_COMMUNITY): Payer: Medicare PPO

## 2017-07-07 DIAGNOSIS — R05 Cough: Secondary | ICD-10-CM | POA: Insufficient documentation

## 2017-07-07 DIAGNOSIS — R059 Cough, unspecified: Secondary | ICD-10-CM

## 2017-07-07 DIAGNOSIS — G35 Multiple sclerosis: Secondary | ICD-10-CM | POA: Insufficient documentation

## 2017-07-07 DIAGNOSIS — F419 Anxiety disorder, unspecified: Secondary | ICD-10-CM | POA: Diagnosis not present

## 2017-07-07 DIAGNOSIS — Z79899 Other long term (current) drug therapy: Secondary | ICD-10-CM | POA: Insufficient documentation

## 2017-07-07 NOTE — ED Notes (Signed)
Patient transported to X-ray 

## 2017-07-07 NOTE — ED Triage Notes (Signed)
Pt from [facility] facility with ems. Facility called stating she had a chest xray done this week and it shows pneumonia. Pt denies any pain or SOB. Pt on room air at 96% BP 160/80 HR 75. CBG 129. Pt alert and oriented per baseline, nad at this time

## 2017-07-07 NOTE — Discharge Instructions (Signed)
Follow-up with your primary care doctor if you develop cough or fever. Return to the emergency room if you develop chest pain, difficulty breathing, or any new or worsening symptoms.

## 2017-07-07 NOTE — ED Provider Notes (Signed)
[facility]-EMERGENCY DEPT Provider Note   CSN: [id number] Arrival date & time: 07/07/17  2133     History   Chief Complaint Chief Complaint  Patient presents with  . Other    Pneumonia    HPI Lindsey Grimes is a 83 y.o. female presenting to the ER for concerns with pneumonia.  Patient reports she does not know why she is here. She states she does not have a fever, chills, cough, sore throat, chest pain, shortness of breath, nausea, vomiting, or any new symptoms. Per EMS, facility called EMS with concerns for pneumonia. Unsure if patient has had a previous chest x-ray showing pneumonia. Per [facility] facility staff member, patient choked on some food a few days ago. She was being evaluated by the in-house doctor, and he had concern for pneumonia. Staff member did not know any further information. Facility was unsure if patient has had prior chest x-ray.  HPI  Past Medical History:  Diagnosis Date  . Anxiety   . Chronic constipation   . Chronic nasal congestion   . Degenerative disc disease   . Hyperlipidemia   . Multiple sclerosis [facility])     Patient Active Problem List   Diagnosis Date Noted  . Fall 03/07/2014  . Right scapula fracture 03/07/2014  . Multiple rib fractures 03/06/2014    History reviewed. No pertinent surgical history.  OB History    No data available       Home Medications    Prior to Admission medications   Medication Sig Start Date End Date Taking? Authorizing Provider  acetaminophen (TYLENOL) 500 MG tablet Take 500 mg by mouth 2 (two) times daily as needed for moderate pain.   Yes [provider]  diclofenac sodium (VOLTAREN) 1 % GEL Apply 2 g topically 2 (two) times daily.   Yes [provider]  docusate sodium (COLACE) 100 MG capsule Take 100 mg by mouth 2 (two) times daily.   Yes [provider]  DULoxetine (CYMBALTA) 30 MG capsule Take 30 mg by mouth daily.   Yes [provider]  HYDROcodone-acetaminophen  (NORCO/VICODIN) 5-325 MG tablet Take 0.5 tablets by mouth 3 (three) times daily.   Yes [provider]  hydroxypropyl methylcellulose / hypromellose (ISOPTO TEARS / GONIOVISC) 2.5 % ophthalmic solution Place 1 drop into both eyes 3 (three) times daily.   Yes [provider]  mineral oil liquid Place 0.1 mLs in ear(s) once a week.   Yes [provider]  OVER THE COUNTER MEDICATION Take 20 mLs by mouth 2 (two) times daily. QC Qualifiber Powder 155G In 6 ounces of cran-grape juice or other liquid   Yes [provider]  traMADol (ULTRAM) 50 MG tablet Take 1 tablet (50 mg total) by mouth every 6 (six) hours as needed (50mg  for mild pain, 75mg  for moderate pain, 100mg  for severe pain). Patient not taking: Reported on 07/07/2017 03/09/14   Ashok Norris, NP    Family History No family history on file.  Social History Social History  Substance Use Topics  . Smoking status: Never Smoker  . Smokeless tobacco: Never Used  . Alcohol use No     Allergies   Patient has no known allergies.   Review of Systems Review of Systems  Constitutional: Negative for chills and fever.  HENT: Negative for congestion, sinus pain, sinus pressure and sore throat.   Respiratory: Negative for cough, chest tightness and shortness of breath.   Cardiovascular: Negative for chest pain and palpitations.  Gastrointestinal: Negative for abdominal pain, constipation, diarrhea, nausea and vomiting.  Genitourinary: Negative for dysuria, frequency and hematuria.  Skin: Negative for rash.  Neurological: Negative for dizziness and headaches.     Physical Exam Updated Vital Signs BP 108/89   Pulse 69   Temp 98.6 F (37 C) (Oral)   Resp (!) 23   SpO2 95%   Physical Exam  Constitutional: She is oriented to person, place, and time. She appears well-developed and well-nourished. No distress.  HENT:  Head: Normocephalic and atraumatic.  Eyes: Pupils are equal, round, and reactive  to light. Conjunctivae and EOM are normal.  Neck: Normal range of motion.  Cardiovascular: Normal rate, regular rhythm and intact distal pulses.   Pulmonary/Chest: Effort normal and breath sounds normal. No respiratory distress. She has no decreased breath sounds. She has no wheezes. She has no rhonchi. She has no rales. She exhibits no tenderness.  Abdominal: Soft. She exhibits no distension. There is no tenderness. There is no guarding.  Musculoskeletal: Normal range of motion.  Lymphadenopathy:    She has no cervical adenopathy.  Neurological: She is alert and oriented to person, place, and time.  Skin: Skin is warm. No rash noted.  Psychiatric: She has a normal mood and affect.  Nursing note and vitals reviewed.    ED Treatments / Results  Labs (all labs ordered are listed, but only abnormal results are displayed) Labs Reviewed - No data to display  EKG  EKG Interpretation None       Radiology Dg Chest 2 View  Result Date: 07/07/2017 CLINICAL DATA:  Possible pneumonia on outside chest radiographs earlier this week. EXAM: CHEST  2 VIEW COMPARISON:  03/07/2014. FINDINGS: The cardiac silhouette remains mildly enlarged. The lungs are clear with mild prominence of the interstitial markings without significant change. Aortic arch calcifications. Bilateral shoulder degenerative changes, greater on the left. Old, healed right rib fractures. IMPRESSION: No acute abnormality. Stable mild cardiomegaly and mild chronic interstitial lung disease. Electronically Signed   By: Beckie Salts M.D.   On: 07/07/2017 23:19    Procedures Procedures (including critical care time)  Medications Ordered in ED Medications - No data to display   Initial Impression / Assessment and Plan / ED Course  I have reviewed the triage vital signs and the nursing notes.  Pertinent labs & imaging results that were available during my care of the patient were reviewed by me and considered in my medical decision  making (see chart for details).     Patient presenting from facility for concerns for pneumonia. Exam reassuring, as patient lungs are clear with good air movement throughout. Patient not reporting any symptoms including cough, fever, or chest pain. Will order chest x-ray for further evaluation.  Chest x-ray shows no sign of infiltrate or infection. Vital signs reassuring throughout visit. Case discussed with attending, Dr. Fredderick Phenix evaluated the patient. At this time, patient is symptom-free and with a negative chest x-ray, doubt pneumonia. Patient appears safe for discharge. Return precautions given. Patient states she understands and agrees to plan.  Final Clinical Impressions(s) / ED Diagnoses   Final diagnoses:  Cough    New Prescriptions New Prescriptions   No medications on file     Alveria Apley, PA-C 07/08/17 0009    Rolan Bucco, MD 07/08/17 [phone]

## 2017-12-15 ENCOUNTER — Encounter (HOSPITAL_COMMUNITY): Payer: Self-pay | Admitting: Emergency Medicine

## 2017-12-15 ENCOUNTER — Emergency Department (HOSPITAL_COMMUNITY)
Admission: EM | Admit: 2017-12-15 | Discharge: 2017-12-16 | Disposition: A | Discharge status: Skilled Nursing Facility | Payer: Medicare PPO | Attending: Emergency Medicine | Admitting: Emergency Medicine

## 2017-12-15 ENCOUNTER — Emergency Department (HOSPITAL_COMMUNITY): Payer: Medicare PPO

## 2017-12-15 DIAGNOSIS — R0602 Shortness of breath: Secondary | ICD-10-CM | POA: Diagnosis present

## 2017-12-15 DIAGNOSIS — R05 Cough: Secondary | ICD-10-CM | POA: Diagnosis not present

## 2017-12-15 DIAGNOSIS — Z79899 Other long term (current) drug therapy: Secondary | ICD-10-CM | POA: Insufficient documentation

## 2017-12-15 DIAGNOSIS — R059 Cough, unspecified: Secondary | ICD-10-CM

## 2017-12-15 LAB — CBC WITH DIFFERENTIAL/PLATELET
BASOS PCT: 0 %
Basophils Absolute: 0 10*3/uL (ref 0.0–0.1)
Eosinophils Absolute: 0 10*3/uL (ref 0.0–0.7)
Eosinophils Relative: 0 %
HEMATOCRIT: 42.6 % (ref 36.0–46.0)
Hemoglobin: 13.9 g/dL (ref 12.0–15.0)
LYMPHS PCT: 12 %
Lymphs Abs: 1 10*3/uL (ref 0.7–4.0)
MCH: 31.1 pg (ref 26.0–34.0)
MCHC: 32.6 g/dL (ref 30.0–36.0)
MCV: 95.3 fL (ref 78.0–100.0)
Monocytes Absolute: 0.9 10*3/uL (ref 0.1–1.0)
Monocytes Relative: 12 %
Neutro Abs: 6.2 10*3/uL (ref 1.7–7.7)
Neutrophils Relative %: 76 %
Platelets: 202 10*3/uL (ref 150–400)
RBC: 4.47 MIL/uL (ref 3.87–5.11)
RDW: 13.3 % (ref 11.5–15.5)
WBC: 8.1 10*3/uL (ref 4.0–10.5)

## 2017-12-15 LAB — I-STAT CHEM 8, ED
BUN: 22 mg/dL — ABNORMAL HIGH (ref 6–20)
CREATININE: 0.4 mg/dL — AB (ref 0.44–1.00)
Calcium, Ion: 1.14 mmol/L — ABNORMAL LOW (ref 1.15–1.40)
Chloride: 104 mmol/L (ref 101–111)
Glucose, Bld: 124 mg/dL — ABNORMAL HIGH (ref 65–99)
HEMATOCRIT: 43 % (ref 36.0–46.0)
Hemoglobin: 14.6 g/dL (ref 12.0–15.0)
POTASSIUM: 3.7 mmol/L (ref 3.5–5.1)
Sodium: 142 mmol/L (ref 135–145)
TCO2: 27 mmol/L (ref 22–32)

## 2017-12-15 NOTE — ED Provider Notes (Signed)
[facility]-EMERGENCY DEPT Provider Note   CSN: [id number] Arrival date & time: 12/15/17  2125     History   Chief Complaint Chief Complaint  Patient presents with  . Shortness of Breath    HPI Lindsey Grimes is a 84 y.o. female.  HPI Patient presented with shortness of breath.  Reportedly had been shortness of breath with a cough at the nursing home today.  Then later sats dropped into the 90s.  Has some mild baseline confusion but is overall mostly appropriate for me.  No chest pain.  No swelling in her legs.  No fevers.  No dysuria.  States she gets around pretty well in her wheelchair.  Reportedly has  viral respiratory infection going around to the assisted living. Past Medical History:  Diagnosis Date  . Anxiety   . Chronic constipation   . Chronic nasal congestion   . Degenerative disc disease   . Hyperlipidemia   . Multiple sclerosis [facility])     Patient Active Problem List   Diagnosis Date Noted  . Fall 03/07/2014  . Right scapula fracture 03/07/2014  . Multiple rib fractures 03/06/2014    History reviewed. No pertinent surgical history.  OB History    No data available       Home Medications    Prior to Admission medications   Medication Sig Start Date End Date Taking? Authorizing Provider  acetaminophen (TYLENOL) 500 MG tablet Take 500 mg by mouth 2 (two) times daily as needed for moderate pain.    [provider]  diclofenac sodium (VOLTAREN) 1 % GEL Apply 2 g topically 2 (two) times daily.    [provider]  docusate sodium (COLACE) 100 MG capsule Take 100 mg by mouth 2 (two) times daily.    [provider]  DULoxetine (CYMBALTA) 30 MG capsule Take 30 mg by mouth daily.    [provider]  HYDROcodone-acetaminophen (NORCO/VICODIN) 5-325 MG tablet Take 0.5 tablets by mouth 3 (three) times daily.    [provider]  hydroxypropyl methylcellulose / hypromellose (ISOPTO TEARS / GONIOVISC) 2.5  % ophthalmic solution Place 1 drop into both eyes 3 (three) times daily.    [provider]  mineral oil liquid Place 0.1 mLs in ear(s) once a week.    [provider]  OVER THE COUNTER MEDICATION Take 20 mLs by mouth 2 (two) times daily. QC Qualifiber Powder 155G In 6 ounces of cran-grape juice or other liquid    [provider]  traMADol (ULTRAM) 50 MG tablet Take 1 tablet (50 mg total) by mouth every 6 (six) hours as needed (50mg  for mild pain, 75mg  for moderate pain, 100mg  for severe pain). Patient not taking: Reported on 07/07/2017 03/09/14   Ashok Norris, NP    Family History History reviewed. No pertinent family history.  Social History Social History   Tobacco Use  . Smoking status: Never Smoker  . Smokeless tobacco: Never Used  Substance Use Topics  . Alcohol use: No  . Drug use: No     Allergies   Patient has no known allergies.   Review of Systems Review of Systems  Constitutional: Negative for appetite change.  HENT: Negative for congestion.   Respiratory: Positive for cough and shortness of breath.   Cardiovascular: Negative for chest pain.  Gastrointestinal: Negative for abdominal pain.  Genitourinary: Negative for flank pain.  Musculoskeletal: Negative for back pain.  Skin: Negative for rash.  Neurological: Negative for syncope.  Hematological: Negative  for adenopathy.  Psychiatric/Behavioral: Negative for confusion.     Physical Exam Updated Vital Signs BP (!) 149/80 (BP Location: Left Arm)   Pulse 92   Temp 98.6 F (37 C) (Oral)   Resp 18   SpO2 96% Comment: at 4 L/min.  Physical Exam  Constitutional: She appears well-developed.  HENT:  Head: Normocephalic.  Eyes: Pupils are equal, round, and reactive to light.  Neck: Neck supple.  Cardiovascular: Regular rhythm.  Pulmonary/Chest: Effort normal.  Slightly harsh breath sounds.  No focal rales or rhonchi.  Abdominal: Soft. There is no tenderness.  Musculoskeletal:        Right lower leg: She exhibits no edema.       Left lower leg: She exhibits no edema.  Lower extremities somewhat contracted.  Skin: Skin is warm. Capillary refill takes less than 2 seconds.     ED Treatments / Results  Labs (all labs ordered are listed, but only abnormal results are displayed) Labs Reviewed  I-STAT CHEM 8, ED - Abnormal; Notable for the following components:      Result Value   BUN 22 (*)    Creatinine, Ser 0.40 (*)    Glucose, Bld 124 (*)    Calcium, Ion 1.14 (*)    All other components within normal limits  CBC WITH DIFFERENTIAL/PLATELET    EKG  EKG Interpretation None       Radiology Dg Chest 2 View  Result Date: 12/15/2017 CLINICAL DATA:  Shortness of breath. EXAM: CHEST  2 VIEW COMPARISON:  Radiographs of July 07, 2017. FINDINGS: Stable cardiomediastinal silhouette. No pneumothorax or pleural effusion is noted. No acute pulmonary disease is noted. Bony thorax is unremarkable. IMPRESSION: No active cardiopulmonary disease. Electronically Signed   By: Lupita Raider, M.D.   On: 12/15/2017 22:30    Procedures Procedures (including critical care time)  Medications Ordered in ED Medications - No data to display   Initial Impression / Assessment and Plan / ED Course  I have reviewed the triage vital signs and the nursing notes.  Pertinent labs & imaging results that were available during my care of the patient were reviewed by me and considered in my medical decision making (see chart for details).     Patient with mild shortness of breath.  Cough.  Not hypoxic here.  X-ray reassuring.  Discussed with patient and her granddaughter.  We will not do more extensive workup at this time.  Has respiratory infection going on the nursing home.  Discharge home.  Final Clinical Impressions(s) / ED Diagnoses   Final diagnoses:  Cough    ED Discharge Orders    None       Benjiman Core, MD 12/15/17 [phone]

## 2017-12-15 NOTE — ED Notes (Signed)
Patient transported to X-ray 

## 2017-12-15 NOTE — ED Notes (Signed)
Bed: WF09 Expected date:  Expected time:  Means of arrival:  Comments: EMS 84 yo female from SNF-SOB/90% RA-hx MS-on 4l/[place]

## 2017-12-15 NOTE — ED Triage Notes (Signed)
Per EMS , pt. From [facility] with SOB started at 4pm this afternoon. Pt. Has MS, no report of fever nor cough. Alert and oriented x2, has DNR form. Denied chest pain.

## 2017-12-16 MED ORDER — ACETAMINOPHEN 325 MG PO TABS
650.0000 mg | ORAL_TABLET | Freq: Once | ORAL | Status: AC
  Administered 2017-12-16: 650 mg via ORAL
  Filled 2017-12-16: qty 2

## 2017-12-16 NOTE — ED Notes (Signed)
[facility] called for transport.  

## 2017-12-16 NOTE — ED Notes (Signed)
[facility] here to transport pt back to [facility] [place].

## 2017-12-22 ENCOUNTER — Telehealth: Payer: Self-pay | Admitting: Family Medicine

## 2017-12-22 NOTE — Telephone Encounter (Signed)
Message Closed.

## 2017-12-22 NOTE — Telephone Encounter (Signed)
Copied from CRM [id number]. Topic: General - Other >> Dec 22, 2017  8:48 AM Gerrianne Scale wrote: Reason for CRM:  Jenn from [facility] [phone] calling to get verbal order care Dr Caryl Never is the attending provider they would like for pt current office visits a Nurse will be going out today at 10:00 today to see patient  please fax papers to [phone]

## 2017-12-22 NOTE — Telephone Encounter (Signed)
I spoke with Jenn from Hospice she states that she had made a mistake thought that the pt was a [facility] patient I stated that pt has never been here she asked if I could just void message out I

## 2018-07-06 IMAGING — CR DG CHEST 2V
2 series · 2 of 2 positions shown · non-contrast
Comparison: Radiographs July 07, 2017.

CLINICAL DATA: Shortness of breath.

EXAM:
CHEST  2 VIEW

[w chest lat]
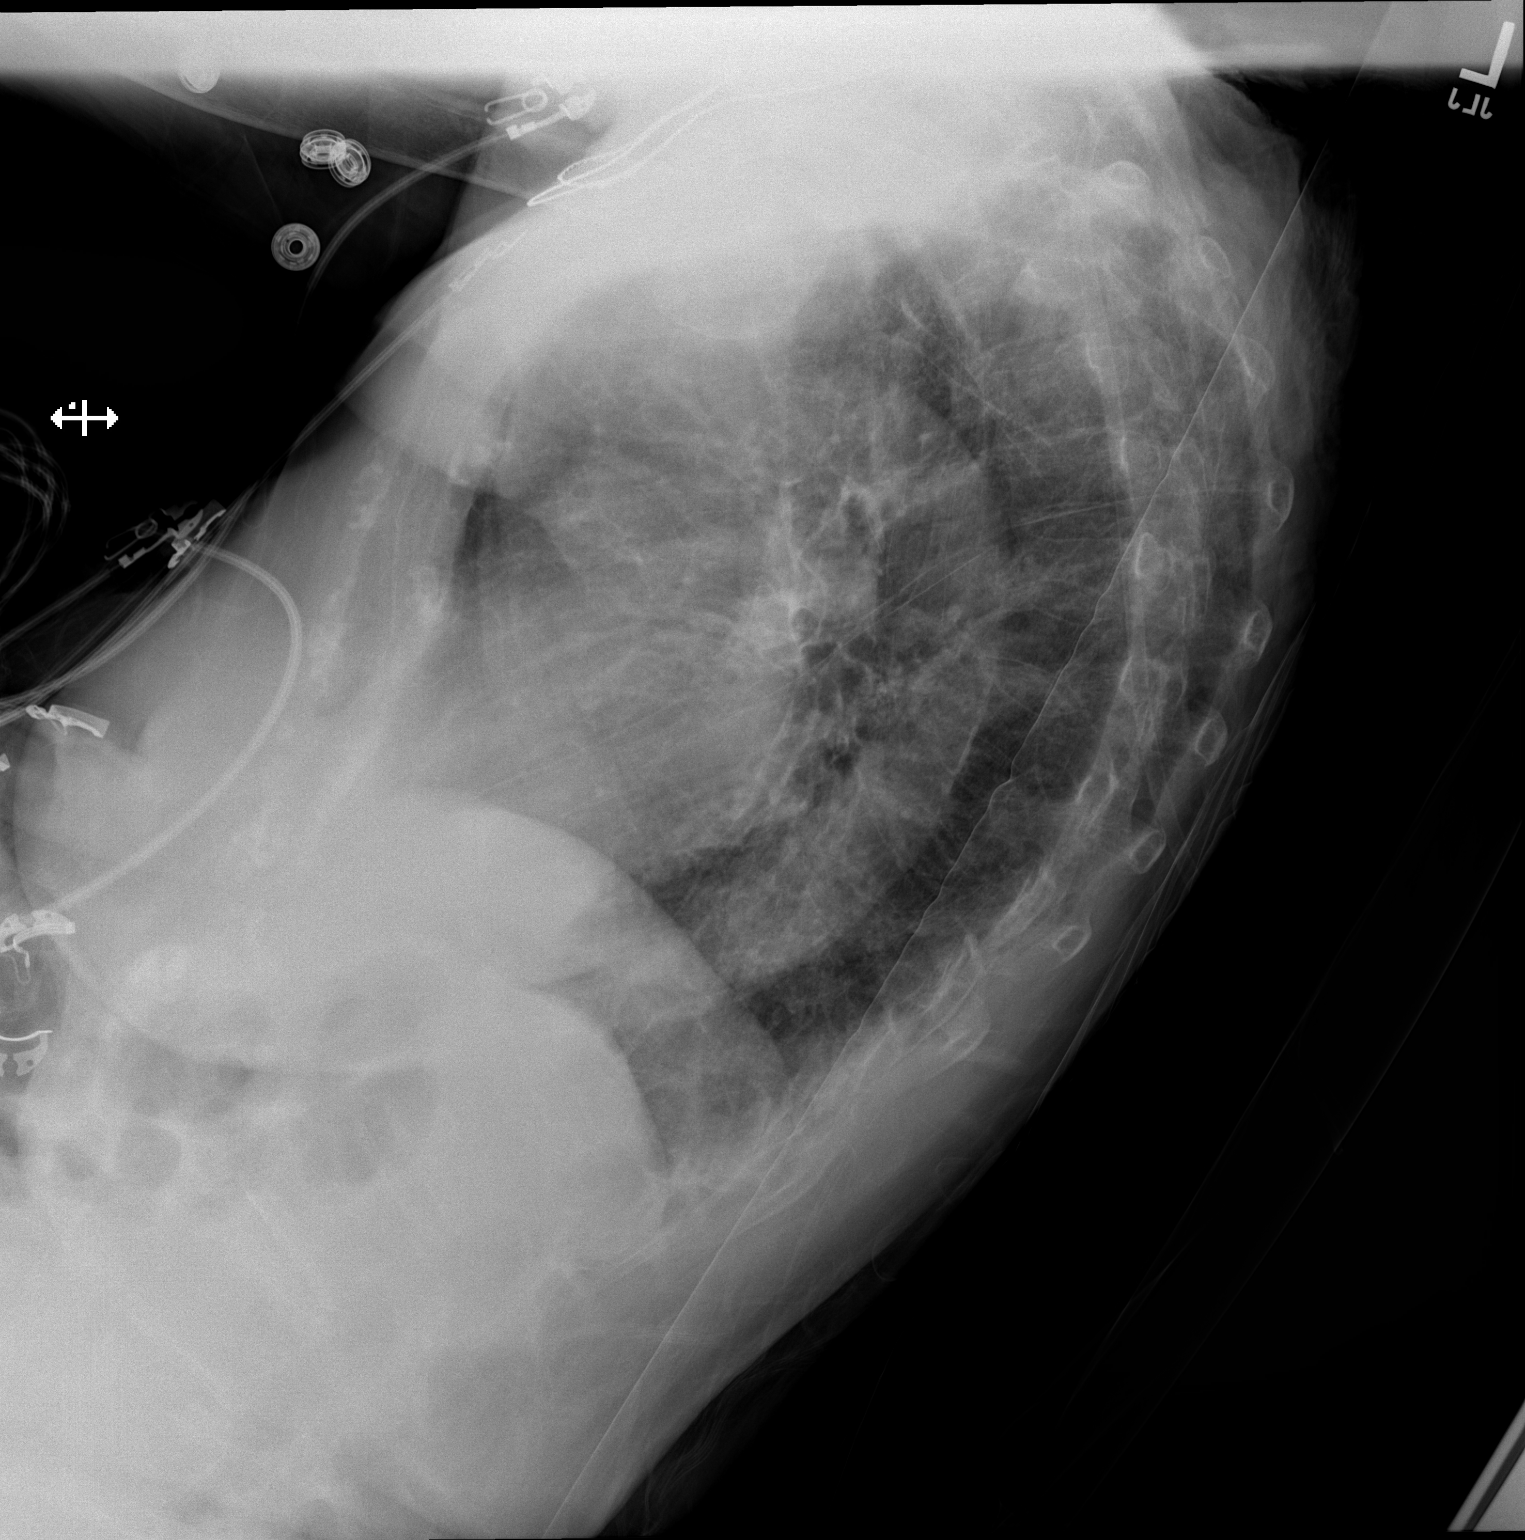

[x chest ap]
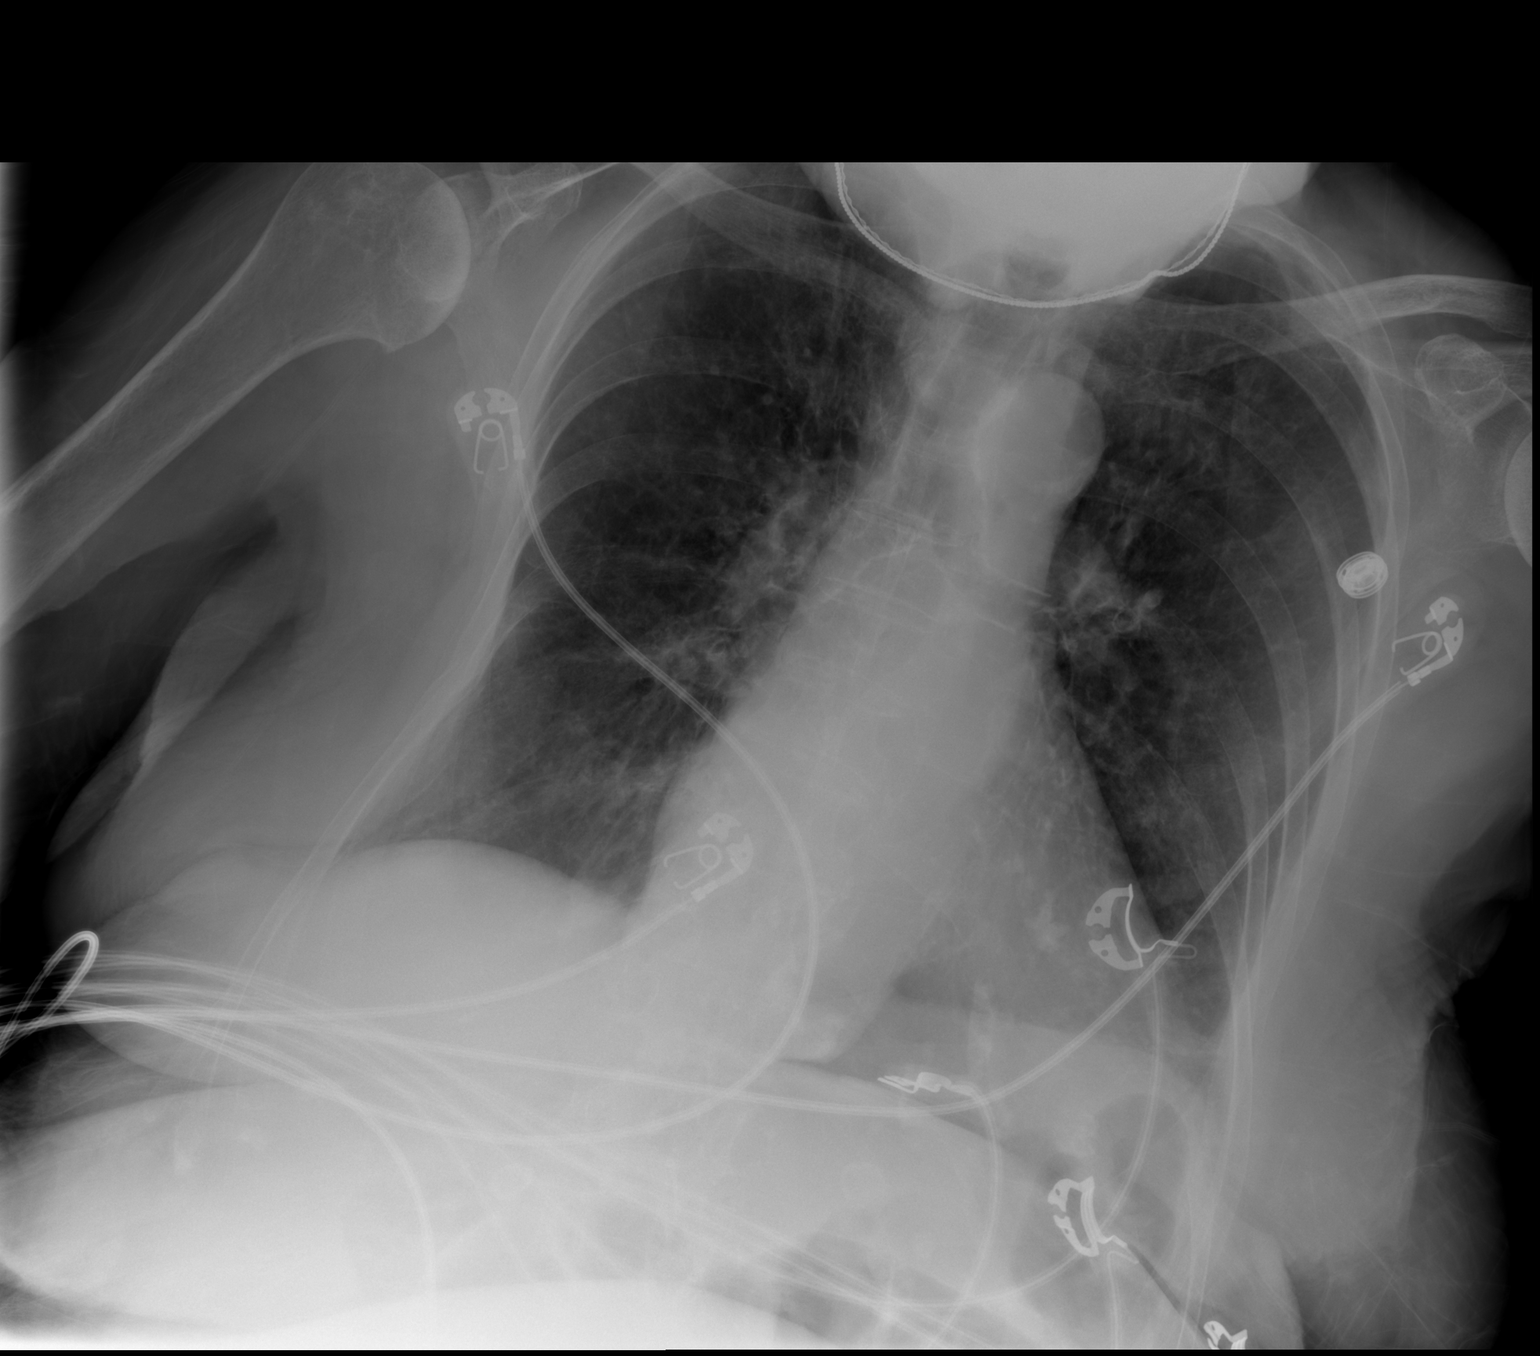

[2 of 2 positions shown; findings below may reference images not displayed]

FINDINGS: Stable cardiomediastinal silhouette. No pneumothorax or pleural
effusion is noted. No acute pulmonary disease is noted. Bony thorax
is unremarkable.
IMPRESSION: No active cardiopulmonary disease.

## 2019-10-25 ENCOUNTER — Telehealth (RURAL_HEALTH_CENTER): Payer: Self-pay | Admitting: Physician Assistant

## 2019-10-25 DIAGNOSIS — M25512 Pain in left shoulder: Secondary | ICD-10-CM

## 2019-10-25 NOTE — Telephone Encounter (Signed)
Patient was in with her sister and said she was still having left shoulder pain after injuring it in August of 2020. She also complained of neck pain. Requested x-rays.  Orders for XR left shoulder and c-spine placed in Epic.  jf

## 2021-03-01 ENCOUNTER — Emergency Department (HOSPITAL_COMMUNITY)

## 2021-03-01 ENCOUNTER — Other Ambulatory Visit: Payer: Self-pay

## 2021-03-01 ENCOUNTER — Encounter (HOSPITAL_COMMUNITY): Payer: Self-pay

## 2021-03-01 ENCOUNTER — Inpatient Hospital Stay (HOSPITAL_COMMUNITY)
Admission: EM | Admit: 2021-03-01 | Discharge: 2021-03-04 | DRG: 193 | Disposition: A | Discharge status: Skilled Nursing Facility | Source: Skilled Nursing Facility | Attending: Internal Medicine | Admitting: Internal Medicine

## 2021-03-01 DIAGNOSIS — J180 Bronchopneumonia, unspecified organism: Secondary | ICD-10-CM | POA: Diagnosis present

## 2021-03-01 DIAGNOSIS — Z66 Do not resuscitate: Secondary | ICD-10-CM | POA: Diagnosis present

## 2021-03-01 DIAGNOSIS — E860 Dehydration: Secondary | ICD-10-CM | POA: Diagnosis present

## 2021-03-01 DIAGNOSIS — J9621 Acute and chronic respiratory failure with hypoxia: Secondary | ICD-10-CM | POA: Diagnosis present

## 2021-03-01 DIAGNOSIS — D696 Thrombocytopenia, unspecified: Secondary | ICD-10-CM | POA: Diagnosis present

## 2021-03-01 DIAGNOSIS — D649 Anemia, unspecified: Secondary | ICD-10-CM | POA: Diagnosis present

## 2021-03-01 DIAGNOSIS — G35 Multiple sclerosis: Secondary | ICD-10-CM | POA: Diagnosis present

## 2021-03-01 DIAGNOSIS — Z681 Body mass index (BMI) 19 or less, adult: Secondary | ICD-10-CM | POA: Diagnosis not present

## 2021-03-01 DIAGNOSIS — E785 Hyperlipidemia, unspecified: Secondary | ICD-10-CM | POA: Diagnosis present

## 2021-03-01 DIAGNOSIS — Z20822 Contact with and (suspected) exposure to covid-19: Secondary | ICD-10-CM | POA: Diagnosis present

## 2021-03-01 DIAGNOSIS — T68XXXA Hypothermia, initial encounter: Secondary | ICD-10-CM | POA: Clinically undetermined

## 2021-03-01 DIAGNOSIS — E872 Acidosis: Secondary | ICD-10-CM | POA: Diagnosis present

## 2021-03-01 DIAGNOSIS — T68XXXD Hypothermia, subsequent encounter: Secondary | ICD-10-CM | POA: Diagnosis not present

## 2021-03-01 DIAGNOSIS — A419 Sepsis, unspecified organism: Principal | ICD-10-CM | POA: Diagnosis present

## 2021-03-01 DIAGNOSIS — F419 Anxiety disorder, unspecified: Secondary | ICD-10-CM | POA: Diagnosis present

## 2021-03-01 DIAGNOSIS — J189 Pneumonia, unspecified organism: Secondary | ICD-10-CM | POA: Diagnosis not present

## 2021-03-01 DIAGNOSIS — R6520 Severe sepsis without septic shock: Secondary | ICD-10-CM | POA: Diagnosis present

## 2021-03-01 DIAGNOSIS — I5033 Acute on chronic diastolic (congestive) heart failure: Secondary | ICD-10-CM | POA: Diagnosis not present

## 2021-03-01 DIAGNOSIS — R0600 Dyspnea, unspecified: Secondary | ICD-10-CM

## 2021-03-01 DIAGNOSIS — Z881 Allergy status to other antibiotic agents status: Secondary | ICD-10-CM

## 2021-03-01 DIAGNOSIS — K5909 Other constipation: Secondary | ICD-10-CM | POA: Diagnosis present

## 2021-03-01 DIAGNOSIS — R509 Fever, unspecified: Secondary | ICD-10-CM

## 2021-03-01 DIAGNOSIS — E8770 Fluid overload, unspecified: Secondary | ICD-10-CM | POA: Diagnosis not present

## 2021-03-01 DIAGNOSIS — R0602 Shortness of breath: Secondary | ICD-10-CM

## 2021-03-01 LAB — CBC WITH DIFFERENTIAL/PLATELET
Abs Immature Granulocytes: 0.03 10*3/uL (ref 0.00–0.07)
Basophils Absolute: 0 10*3/uL (ref 0.0–0.1)
Basophils Relative: 0 %
Eosinophils Absolute: 0 10*3/uL (ref 0.0–0.5)
Eosinophils Relative: 0 %
HCT: 42.9 % (ref 36.0–46.0)
Hemoglobin: 13.4 g/dL (ref 12.0–15.0)
Immature Granulocytes: 0 %
Lymphocytes Relative: 15 %
Lymphs Abs: 1.4 10*3/uL (ref 0.7–4.0)
MCH: 31 pg (ref 26.0–34.0)
MCHC: 31.2 g/dL (ref 30.0–36.0)
MCV: 99.3 fL (ref 80.0–100.0)
Monocytes Absolute: 0.7 10*3/uL (ref 0.1–1.0)
Monocytes Relative: 7 %
Neutro Abs: 7.5 10*3/uL (ref 1.7–7.7)
Neutrophils Relative %: 78 %
Platelets: 159 10*3/uL (ref 150–400)
RBC: 4.32 MIL/uL (ref 3.87–5.11)
RDW: 13.2 % (ref 11.5–15.5)
WBC: 9.6 10*3/uL (ref 4.0–10.5)
nRBC: 0 % (ref 0.0–0.2)

## 2021-03-01 LAB — URINALYSIS, ROUTINE W REFLEX MICROSCOPIC
Bilirubin Urine: NEGATIVE
Glucose, UA: NEGATIVE mg/dL
Hgb urine dipstick: NEGATIVE
Ketones, ur: 5 mg/dL — AB
Nitrite: NEGATIVE
Protein, ur: NEGATIVE mg/dL
Specific Gravity, Urine: 1.02 (ref 1.005–1.030)
pH: 6 (ref 5.0–8.0)

## 2021-03-01 LAB — COMPREHENSIVE METABOLIC PANEL
ALT: 11 U/L (ref 0–44)
AST: 19 U/L (ref 15–41)
Albumin: 3.9 g/dL (ref 3.5–5.0)
Alkaline Phosphatase: 79 U/L (ref 38–126)
Anion gap: 10 (ref 5–15)
BUN: 20 mg/dL (ref 8–23)
CO2: 29 mmol/L (ref 22–32)
Calcium: 8.7 mg/dL — ABNORMAL LOW (ref 8.9–10.3)
Chloride: 102 mmol/L (ref 98–111)
Creatinine, Ser: 0.56 mg/dL (ref 0.44–1.00)
GFR, Estimated: >60 mL/min (ref >60)
Glucose, Bld: 140 mg/dL — ABNORMAL HIGH (ref 70–99)
Potassium: 3.8 mmol/L (ref 3.5–5.1)
Sodium: 141 mmol/L (ref 135–145)
Total Bilirubin: 0.3 mg/dL (ref 0.3–1.2)
Total Protein: 7 g/dL (ref 6.5–8.1)

## 2021-03-01 LAB — MRSA PCR SCREENING: MRSA by PCR: NEGATIVE

## 2021-03-01 LAB — LACTIC ACID, PLASMA
Lactic Acid, Venous: 2 mmol/L (ref 0.5–1.9)
Lactic Acid, Venous: 2.7 mmol/L (ref 0.5–1.9)
Lactic Acid, Venous: 2.3 mmol/L (ref 0.5–1.9)

## 2021-03-01 LAB — RESP PANEL BY RT-PCR (FLU A&B, COVID) ARPGX2
Influenza A by PCR: NEGATIVE
Influenza B by PCR: NEGATIVE
SARS Coronavirus 2 by RT PCR: NEGATIVE

## 2021-03-01 LAB — APTT: aPTT: 28 seconds (ref 24–36)

## 2021-03-01 LAB — PROTIME-INR
INR: 1.1 (ref 0.8–1.2)
Prothrombin Time: 13.9 seconds (ref 11.4–15.2)

## 2021-03-01 LAB — CBG MONITORING, ED: Glucose-Capillary: 114 mg/dL — ABNORMAL HIGH (ref 70–99)

## 2021-03-01 MED ORDER — HYPROMELLOSE (GONIOSCOPIC) 2.5 % OP SOLN: 1.0000 [drp] | Freq: Three times a day (TID) | OPHTHALMIC | Status: DC

## 2021-03-01 MED ORDER — LORATADINE 10 MG PO TABS
10.0000 mg | ORAL_TABLET | Freq: Every day | ORAL | Status: DC
  Administered 2021-03-02 – 2021-03-04 (×3): 10 mg via ORAL
  Filled 2021-03-01 (×3): qty 1

## 2021-03-01 MED ORDER — LACTATED RINGERS IV SOLN: INTRAVENOUS | Status: DC

## 2021-03-01 MED ORDER — SODIUM CHLORIDE 0.9 % IV SOLN
2.0000 g | INTRAVENOUS | Status: DC
  Administered 2021-03-02 – 2021-03-03 (×2): 2 g via INTRAVENOUS
  Filled 2021-03-01 (×3): qty 2

## 2021-03-01 MED ORDER — IPRATROPIUM-ALBUTEROL 0.5-2.5 (3) MG/3ML IN SOLN: 3.0000 mL | Freq: Three times a day (TID) | RESPIRATORY_TRACT | Status: DC | PRN

## 2021-03-01 MED ORDER — HYDROCODONE-ACETAMINOPHEN 5-325 MG PO TABS
0.5000 | ORAL_TABLET | Freq: Three times a day (TID) | ORAL | Status: DC
  Administered 2021-03-01 – 2021-03-04 (×9): 0.5 via ORAL
  Filled 2021-03-01 (×9): qty 1

## 2021-03-01 MED ORDER — FAMOTIDINE 20 MG PO TABS
40.0000 mg | ORAL_TABLET | Freq: Every day | ORAL | Status: DC
  Administered 2021-03-01 – 2021-03-02 (×2): 40 mg via ORAL
  Filled 2021-03-01 (×3): qty 2

## 2021-03-01 MED ORDER — VITAMIN D (ERGOCALCIFEROL) 1.25 MG (50000 UNIT) PO CAPS
50000.0000 [IU] | ORAL_CAPSULE | ORAL | Status: DC
  Administered 2021-03-02: 50000 [IU] via ORAL
  Filled 2021-03-01: qty 1

## 2021-03-01 MED ORDER — LACTATED RINGERS IV BOLUS (SEPSIS)
1000.0000 mL | Freq: Once | INTRAVENOUS | Status: AC
  Administered 2021-03-01: 1000 mL via INTRAVENOUS

## 2021-03-01 MED ORDER — IOHEXOL 350 MG/ML SOLN
100.0000 mL | Freq: Once | INTRAVENOUS | Status: AC | PRN
  Administered 2021-03-01: 57 mL via INTRAVENOUS

## 2021-03-01 MED ORDER — CHLORHEXIDINE GLUCONATE CLOTH 2 % EX PADS
6.0000 | MEDICATED_PAD | Freq: Every day | CUTANEOUS | Status: DC
  Administered 2021-03-02 – 2021-03-04 (×3): 6 via TOPICAL

## 2021-03-01 MED ORDER — DICLOFENAC SODIUM 1 % TD GEL: 2.0000 g | Freq: Two times a day (BID) | TRANSDERMAL | Status: DC | PRN

## 2021-03-01 MED ORDER — GUAIFENESIN ER 600 MG PO TB12
1200.0000 mg | ORAL_TABLET | Freq: Two times a day (BID) | ORAL | Status: DC
  Administered 2021-03-01 – 2021-03-04 (×6): 1200 mg via ORAL
  Filled 2021-03-01 (×6): qty 2

## 2021-03-01 MED ORDER — SODIUM CHLORIDE 0.9 % IV SOLN
2.0000 g | Freq: Once | INTRAVENOUS | Status: AC
  Administered 2021-03-01: 2 g via INTRAVENOUS
  Filled 2021-03-01: qty 2

## 2021-03-01 MED ORDER — SENNOSIDES-DOCUSATE SODIUM 8.6-50 MG PO TABS
1.0000 | ORAL_TABLET | Freq: Two times a day (BID) | ORAL | Status: DC
  Administered 2021-03-01 – 2021-03-04 (×6): 1 via ORAL
  Filled 2021-03-01 (×6): qty 1

## 2021-03-01 MED ORDER — VANCOMYCIN HCL IN DEXTROSE 1-5 GM/200ML-% IV SOLN
1000.0000 mg | Freq: Once | INTRAVENOUS | Status: AC
  Administered 2021-03-01: 1000 mg via INTRAVENOUS
  Filled 2021-03-01: qty 200

## 2021-03-01 MED ORDER — MORPHINE SULFATE (CONCENTRATE) 10 MG/0.5ML PO SOLN: 5.0000 mg | Freq: Four times a day (QID) | ORAL | Status: DC | PRN

## 2021-03-01 MED ORDER — LACTATED RINGERS IV BOLUS (SEPSIS)
250.0000 mL | Freq: Once | INTRAVENOUS | Status: AC
  Administered 2021-03-01: 250 mL via INTRAVENOUS

## 2021-03-01 MED ORDER — MINERAL OIL PO OIL: 0.1000 mL | TOPICAL_OIL | ORAL | Status: DC

## 2021-03-01 MED ORDER — POLYVINYL ALCOHOL 1.4 % OP SOLN: 1.0000 [drp] | OPHTHALMIC | Status: DC | PRN

## 2021-03-01 MED ORDER — DULOXETINE HCL 30 MG PO CPEP
30.0000 mg | ORAL_CAPSULE | Freq: Every day | ORAL | Status: DC
  Administered 2021-03-01 – 2021-03-04 (×4): 30 mg via ORAL
  Filled 2021-03-01 (×3): qty 1

## 2021-03-01 MED ORDER — METRONIDAZOLE IN NACL 5-0.79 MG/ML-% IV SOLN
500.0000 mg | Freq: Once | INTRAVENOUS | Status: AC
  Administered 2021-03-01: 500 mg via INTRAVENOUS
  Filled 2021-03-01: qty 100

## 2021-03-01 MED ORDER — ORAL CARE MOUTH RINSE
15.0000 mL | Freq: Two times a day (BID) | OROMUCOSAL | Status: DC
  Administered 2021-03-02 – 2021-03-04 (×5): 15 mL via OROMUCOSAL

## 2021-03-01 MED ORDER — MORPHINE SULFATE (PF) 2 MG/ML IV SOLN
0.5000 mg | INTRAVENOUS | Status: DC | PRN
  Administered 2021-03-01 – 2021-03-04 (×2): 0.5 mg via INTRAVENOUS
  Filled 2021-03-01 (×2): qty 1

## 2021-03-01 MED ORDER — IPRATROPIUM-ALBUTEROL 0.5-2.5 (3) MG/3ML IN SOLN
3.0000 mL | Freq: Three times a day (TID) | RESPIRATORY_TRACT | Status: DC
  Administered 2021-03-01 – 2021-03-02 (×2): 3 mL via RESPIRATORY_TRACT
  Filled 2021-03-01 (×2): qty 3

## 2021-03-01 NOTE — Progress Notes (Signed)
PHARMACY NOTE -  Cefepime  Pharmacy has been assisting with dosing of cefepime for pneumonia.  Dosage remains stable at 2g IV q24 hr and further renal adjustments per institutional Pharmacy antibiotic protocol  Pharmacy will sign off, following peripherally for culture results or dose adjustments. Please reconsult if a change in clinical status warrants re-evaluation of dosage.  Bernadene Person, PharmD, BCPS [phone] 03/01/2021, 4:33 PM

## 2021-03-01 NOTE — Sepsis Progress Note (Signed)
elink monitoring code sepsis.  

## 2021-03-01 NOTE — ED Notes (Signed)
Pt taken off of the bair hugger

## 2021-03-01 NOTE — ED Notes (Signed)
Daughter, Elinor Parkinson at bedside.

## 2021-03-01 NOTE — H&P (Signed)
History and Physical    Lindsey Grimes LHT:[id number] DOB: 12-Apr-1917 DOA: 03/01/2021  PCP: Audree Bane, DO  Patient coming from: [facility] nursing facility  I have personally briefly reviewed patient's old medical records in [facility] Link  Chief Complaint: Fever/shortness of breath  HPI: Lindsey Grimes is a 87 y.o. female with medical history significant of multiple sclerosis, chronic constipation, hyperlipidemia, degenerative disc disease, anxiety, resident at nursing facility, being followed by hospice who presented to the ED from nursing facility with shortness of breath, fever (100.2).  Per ED report EMS noted rhonchi bilaterally patient received neb treatments x2 prior to arrival in the ED and given 300 cc of normal saline. History obtained from ED physician and also partly from patient.  Per ED physician patient with decreased oral intake for several days noted to be febrile with a productive cough. Patient denies any recent fevers, no chills, no nausea, no vomiting, no chest pain, no shortness of breath, no abdominal pain, no diarrhea, no constipation, no melena, no hematemesis, no hematochezia, no syncopal episode.  Patient denies any significant decreased oral intake.  ED Course: Patient seen in the ED initially noted to have a lactic acid level of 2.0 which went up to 2.7.  Urinalysis done trace leukocytes, nitrite negative, 6-10 WBCs, specific gravity of 1.020.  Comprehensive metabolic profile done with a glucose of 140, calcium of 8.7 otherwise was within normal limits.  CBC was unremarkable.  SARS coronavirus 2 PCR negative.  Influenza A and B PCR negative.  Chest x-ray done unremarkable.  CT angiogram chest done, negative for PE, new distal airway mucus impaction and patchy densities in the posterior lingula may reflect atelectasis versus mild bronchopneumonia.  New 4 mm pulmonary nodule in the medial aspect of the left lower lobe, no further follow-up suggested due  to patient age.  Aortic atherosclerosis.  Patient placed on IV fluids, given IV cefepime, IV Flagyl, IV vancomycin in the ED.  1 set of blood cultures obtained.  Urine cultures pending. Hospitalist were called to admit the patient for further evaluation and management.  Review of Systems: As per HPI otherwise all other systems reviewed and are negative  Past Medical History:  Diagnosis Date  . Anxiety   . Chronic constipation   . Chronic nasal congestion   . Degenerative disc disease   . Hyperlipidemia   . Multiple sclerosis (HCC)     History reviewed. No pertinent surgical history.  Social History  reports that she has never smoked. She has never used smokeless tobacco. She reports that she does not drink alcohol and does not use drugs.  Allergies  Allergen Reactions  . Augmentin [Amoxicillin-Pot Clavulanate] Other (See Comments)    unknown    History reviewed. No pertinent family history. Patient 87 years old.  No pertinent family history.  Prior to Admission medications   Medication Sig Start Date End Date Taking? Authorizing Provider  cetirizine (ZYRTEC) 10 MG tablet Take 10 mg by mouth daily.   Yes [provider]  diclofenac sodium (VOLTAREN) 1 % GEL Apply 2 g topically 2 (two) times daily as needed (pain).   Yes [provider]  DULoxetine (CYMBALTA) 30 MG capsule Take 30 mg by mouth daily.   Yes [provider]  famotidine (PEPCID) 40 MG tablet Take 40 mg by mouth at bedtime.   Yes [provider]  HYDROcodone-acetaminophen (NORCO/VICODIN) 5-325 MG tablet Take 0.5 tablets by mouth 3 (three) times daily.   Yes  [provider]  hydroxypropyl methylcellulose / hypromellose (ISOPTO TEARS / GONIOVISC) 2.5 % ophthalmic solution Place 1 drop into both eyes 3 (three) times daily.   Yes [provider]  ipratropium-albuterol (DUONEB) 0.5-2.5 (3) MG/3ML SOLN Take 3 mLs by nebulization every 8 (eight) hours as needed (wheezing  /SOB).   Yes [provider]  mineral oil liquid Place 0.1 mLs in ear(s) once a week. Both ears   Yes [provider]  morphine (ROXANOL) 20 MG/ML concentrated solution Take 5 mg by mouth every 6 (six) hours as needed for severe pain or shortness of breath.   Yes [provider]  OXYGEN Inhale 2 L into the lungs daily as needed (SOB).   Yes [provider]  Sennosides-Docusate Sodium (SENNA PLUS PO) Take 2 tablets by mouth 2 (two) times daily.   Yes [provider]  Vitamin D, Ergocalciferol, (DRISDOL) 1.25 MG (50000 UNIT) CAPS capsule Take 50,000 Units by mouth every 7 (seven) days.   Yes [provider]  traMADol (ULTRAM) 50 MG tablet Take 1 tablet (50 mg total) by mouth every 6 (six) hours as needed (50mg  for mild pain, 75mg  for moderate pain, 100mg  for severe pain). Patient not taking: No sig reported 03/09/14   , NP    Physical Exam: Vitals:   03/01/21 1530 03/01/21 1600 03/01/21 1614 03/01/21 1615  BP: (!) 157/99 136/82  138/78  Pulse: (!) 58 (!) 59  (!) 58  Resp: 13 19  13   Temp:   (!) 96.5 F (35.8 C)   TempSrc:   Rectal   SpO2: 100% 99%  97%  Weight:      Height:        Constitutional: NAD, calm, comfortable.  Significant kyphosis. Vitals:   03/01/21 1530 03/01/21 1600 03/01/21 1614 03/01/21 1615  BP: (!) 157/99 136/82  138/78  Pulse: (!) 58 (!) 59  (!) 58  Resp: 13 19  13   Temp:   (!) 96.5 F (35.8 C)   TempSrc:   Rectal   SpO2: 100% 99%  97%  Weight:      Height:       Eyes: PERRL, lids and conjunctivae normal ENMT: Mucous membranes are dry. Posterior pharynx clear of any exudate or lesions.Normal dentition.  Neck: normal, supple, no masses, no thyromegaly Respiratory: Some decreased breath sounds in the bases.  Minimal expiratory wheezing.  No crackles noted.  No significant rhonchi noted.  No use of accessory muscles of respiration. Cardiovascular: Regular rate and rhythm, no murmurs / rubs /  gallops. No extremity edema. 2+ pedal pulses. No carotid bruits.  Abdomen: no tenderness, no masses palpated. No hepatosplenomegaly. Bowel sounds positive.  Musculoskeletal: no clubbing / cyanosis.  Significant kyphosis.  No joint deformity upper and lower extremities.Normal muscle tone.  Skin: no rashes, lesions, ulcers. No induration Neurologic: CN 2-12 grossly intact. Sensation intact, unable to assess reflexes symmetrically and diffusely.  3-4/5 strength in all 4 limbs.   Psychiatric: Fair judgment and insight. Alert and oriented x 3. Normal mood.    Labs on Admission: I have personally reviewed following labs and imaging studies  CBC: Recent Labs  Lab 03/01/21 1056  WBC 9.6  NEUTROABS 7.5  HGB 13.4  HCT 42.9  MCV 99.3  PLT 159    Basic Metabolic Panel: Recent Labs  Lab 03/01/21 1056  NA 141  K 3.8  CL 102  CO2 29  GLUCOSE 140*  BUN 20  CREATININE 0.56  CALCIUM 8.7*  GFR: Estimated Creatinine Clearance: 23.5 mL/min (by C-G formula based on SCr of 0.56 mg/dL).  Liver Function Tests: Recent Labs  Lab 03/01/21 1056  AST 19  ALT 11  ALKPHOS 79  BILITOT 0.3  PROT 7.0  ALBUMIN 3.9    Urine analysis:    Component Value Date/Time   COLORURINE YELLOW 03/01/2021 1410   APPEARANCEUR CLEAR 03/01/2021 1410   LABSPEC 1.020 03/01/2021 1410   PHURINE 6.0 03/01/2021 1410   GLUCOSEU NEGATIVE 03/01/2021 1410   HGBUR NEGATIVE 03/01/2021 1410   BILIRUBINUR NEGATIVE 03/01/2021 1410   KETONESUR 5 (A) 03/01/2021 1410   PROTEINUR NEGATIVE 03/01/2021 1410   UROBILINOGEN 0.2 03/06/2014 1140   NITRITE NEGATIVE 03/01/2021 1410   LEUKOCYTESUR TRACE (A) 03/01/2021 1410    Radiological Exams on Admission: CT Angio Chest PE W/Cm &/Or Wo Cm  Result Date: 03/01/2021 CLINICAL DATA:  Fever and shortness of breath. EXAM: CT ANGIOGRAPHY CHEST WITH CONTRAST TECHNIQUE: Multidetector CT imaging of the chest was performed using the standard protocol during bolus administration of  intravenous contrast. Multiplanar CT image reconstructions and MIPs were obtained to evaluate the vascular anatomy. CONTRAST:  70mL OMNIPAQUE IOHEXOL 350 MG/ML SOLN COMPARISON:  Chest x-ray from same day. CT chest dated March 06, 2014. FINDINGS: Cardiovascular: Satisfactory opacification of the pulmonary arteries to the segmental level. No evidence of pulmonary embolism. Normal heart size. No pericardial effusion. No thoracic aortic aneurysm. Coronary, aortic arch, and branch vessel atherosclerotic vascular disease. Mediastinum/Nodes: No enlarged mediastinal, hilar, or axillary lymph nodes. Thyroid gland, trachea, and esophagus demonstrate no significant findings. Lungs/Pleura: Biapical pleuroparenchymal scarring in noted. Mild chronic bronchiectasis and peripheral scarring in the right middle lobe. New distal airways mucous impaction and patchy densities in the posterior lingula. Minimal subsegmental atelectasis in the left lower lobe. No focal consolidation, pleural effusion, or pneumothorax. New 4 mm pulmonary nodule in the medial aspect of the left lower lobe (series 6, image 70). Upper Abdomen: No acute abnormality. Musculoskeletal: No chest wall abnormality. No acute or significant osseous findings. Multiple old right-sided rib fractures. Severe bilateral glenohumeral osteoarthritis. Exaggerated upper thoracic kyphosis. Review of the MIP images confirms the above findings. IMPRESSION: 1. No evidence of pulmonary embolism. 2. New distal airways mucous impaction and patchy densities in the posterior lingula may reflect atelectasis versus mild bronchopneumonia. 3. New 4 mm pulmonary nodule in the medial aspect of the left lower lobe. No further follow-up suggested due to patient age. 4. Aortic Atherosclerosis (ICD10-I70.0). Electronically Signed   By: Obie Dredge M.D.   On: 03/01/2021 15:24   DG Chest Port 1 View  Result Date: 03/01/2021 CLINICAL DATA:  Questionable sepsis EXAM: PORTABLE CHEST 1 VIEW  COMPARISON:  12/15/2017 FINDINGS: Normal heart size and mediastinal contours. No acute infiltrate or edema. No effusion or pneumothorax. No acute osseous findings. IMPRESSION: Stable chest.  No visible pneumonia. Electronically Signed   By: Marnee Spring M.D.   On: 03/01/2021 10:56    EKG: Independently reviewed.  Normal sinus rhythm with no ischemic changes noted.  Assessment/Plan Principal Problem:   Fever Active Problems:   PNA (pneumonia) : Probable   Dehydration   Hypothermia   1 fever/hypothermia -Patient brought to the ED due to concerns for fever, shortness of breath, productive cough.  Chest x-ray done unremarkable.  1 set of blood cultures ordered in the ED and pending.  Urine cultures pending.  Check blood cultures x2.  CT angiogram chest done concerning for mucous plugging versus bronchopneumonia.  Lactic acid noted to be  elevated at 2.0 and going up to 2.7.  CBC with normal white count. -Patient noted to be hypothermic per RN in the ED later on after patient was assessed with a rectal temperature of 96.5.  Warm blankets placed on patient. -Check urine Legionella antigen.  Check urine pneumococcus antigen.  Check a procalcitonin level. -Check MRSA PCR. -Patient received a dose of IV vancomycin, IV cefepime, IV Flagyl in the ED. -We will place empirically on IV cefepime pending MRSA PCR screening. -Gentle hydration with IV fluids. -Bair hugger. -Due to need for bair hugger will place in the stepdown unit for now.  2.  Probable pneumonia -CT angiogram chest done concerning for mucous plugging versus probably pneumonia.  It was noted per ED please note that patient had presented from facility with shortness of breath.  Check urine Legionella antigen.  Check urine pneumococcus antigen.  Check MRSA PCR screening.  SLP.  Placed on Mucinex, pulmonary toilet, IV cefepime, scheduled duo nebs.  Supportive care.  Follow.  3.  Dehydration -Gentle hydration with IV fluids.  4.   Chronic constipation -Resume home regimen Senokot-S twice daily.  DVT prophylaxis: Lovenox Code Status:   DNR Family Communication: Updated and discussed with daughter Laverna PeaceRaeLynn Wahl ([phone] cell [phone] home phone)-discussed with daughter and will continue treatment with IV antibiotics for reversible infections, IV fluids if needed, will not escalate care.  Patient being followed by hospice in the outpatient setting. Disposition Plan:   [facility] is [facility]  Anticipated DC to:  Back to nursing facility  Anticipated DC date:  4 to 5 days  Anticipated DC barriers: Undetermined  Consults called:  None Admission status:  Admit to stepdown unit.  Severity of Illness: Inpatient    Ramiro Harvestaniel Jaxon Flatt MD [facility]  How to contact the [facility] Attending or Consulting provider 7A - 7P or covering provider during after hours 7P -7A, for this patient?   1. Check the care team in [facility] and look for a) attending/consulting TR[facility] provider listed and b) the [facility] team listed 2. Log into [email or web address] and use [facility] universal password to access. If you do not have the password, please contact the hospital operator. 3. Locate the [facility] provider you are looking for under [facility] and page to a number that you can be directly reached. 4. If you still have difficulty reaching the provider, please page the [facility] (Director on Call) for the Hospitalists listed on amion for assistance.  03/01/2021, 4:18 PM

## 2021-03-01 NOTE — ED Provider Notes (Signed)
[facility]-EMERGENCY DEPT Provider Note   CSN: [id number] Arrival date & time: 03/01/21  1008     History No chief complaint on file.   Lindsey Grimes is a 87 y.o. female.  87 year old female presents for nursing home with concern for possible choking episode.  Patient was found to be febrile and having productive cough.  She is also had decreased oral intake over the last several days.  EMS gave the patient IV fluids and transported here.        Past Medical History:  Diagnosis Date  . Anxiety   . Chronic constipation   . Chronic nasal congestion   . Degenerative disc disease   . Hyperlipidemia   . Multiple sclerosis [facility])     Patient Active Problem List   Diagnosis Date Noted  . Fall 03/07/2014  . Right scapula fracture 03/07/2014  . Multiple rib fractures 03/06/2014    No past surgical history on file.   OB History   No obstetric history on file.     No family history on file.  Social History   Tobacco Use  . Smoking status: Never Smoker  . Smokeless tobacco: Never Used  Substance Use Topics  . Alcohol use: No  . Drug use: No    Home Medications Prior to Admission medications   Medication Sig Start Date End Date Taking? Authorizing Provider  acetaminophen (TYLENOL) 500 MG tablet Take 500 mg by mouth 2 (two) times daily as needed for moderate pain.    [provider]  diclofenac sodium (VOLTAREN) 1 % GEL Apply 2 g topically 2 (two) times daily.    [provider]  docusate sodium (COLACE) 100 MG capsule Take 100 mg by mouth 2 (two) times daily.    [provider]  DULoxetine (CYMBALTA) 30 MG capsule Take 30 mg by mouth daily.    [provider]  HYDROcodone-acetaminophen (NORCO/VICODIN) 5-325 MG tablet Take 0.5 tablets by mouth 3 (three) times daily.    [provider]  hydroxypropyl methylcellulose / hypromellose (ISOPTO TEARS / GONIOVISC) 2.5 % ophthalmic solution Place 1 drop into  both eyes 3 (three) times daily.    [provider]  mineral oil liquid Place 0.1 mLs in ear(s) once a week.    [provider]  OVER THE COUNTER MEDICATION Take 20 mLs by mouth 2 (two) times daily. QC Qualifiber Powder 155G In 6 ounces of cran-grape juice or other liquid    [provider]  traMADol (ULTRAM) 50 MG tablet Take 1 tablet (50 mg total) by mouth every 6 (six) hours as needed (50mg  for mild pain, 75mg  for moderate pain, 100mg  for severe pain). Patient not taking: Reported on 07/07/2017 03/09/14   , NP    Allergies    Patient has no known allergies.  Review of Systems   Review of Systems  Unable to perform ROS: Acuity of condition    Physical Exam Updated Vital Signs There were no vitals taken for this visit.  Physical Exam Vitals and nursing note reviewed.  Constitutional:      General: She is not in acute distress.    Appearance: Normal appearance. She is well-developed. She is not toxic-appearing.  HENT:     Head: Normocephalic and atraumatic.  Eyes:     General: Lids are normal.     Conjunctiva/sclera: Conjunctivae normal.     Pupils: Pupils are equal, round, and reactive to light.  Neck:     Thyroid: No  thyroid mass.     Trachea: No tracheal deviation.  Cardiovascular:     Rate and Rhythm: Normal rate and regular rhythm.     Heart sounds: Normal heart sounds. No murmur heard. No gallop.   Pulmonary:     Effort: Pulmonary effort is normal. Prolonged expiration present. No respiratory distress.     Breath sounds: Normal breath sounds. Decreased air movement present. No decreased breath sounds, wheezing, rhonchi or rales.  Abdominal:     General: Bowel sounds are normal. There is no distension.     Palpations: Abdomen is soft.     Tenderness: There is no abdominal tenderness. There is no rebound.  Musculoskeletal:        General: No tenderness. Normal range of motion.     Cervical back: Normal range of motion and neck  supple.  Skin:    General: Skin is warm and dry.     Findings: No abrasion or rash.  Neurological:     Mental Status: She is oriented to person, place, and time. She is lethargic.     GCS: GCS eye subscore is 4. GCS verbal subscore is 5. GCS motor subscore is 6.     Cranial Nerves: No cranial nerve deficit.     Sensory: No sensory deficit.  Psychiatric:        Attention and Perception: Attention normal.        Mood and Affect: Affect is flat.     ED Results / Procedures / Treatments   Labs (all labs ordered are listed, but only abnormal results are displayed) Labs Reviewed - No data to display  EKG None  Radiology No results found.  Procedures Procedures   Medications Ordered in ED Medications  lactated ringers infusion (has no administration in time range)    ED Course  I have reviewed the triage vital signs and the nursing notes.  Pertinent labs & imaging results that were available during my care of the patient were reviewed by me and considered in my medical decision making (see chart for details).    MDM Rules/Calculators/A&P                          Patient's lactate is increasing here.  Code sepsis antibiotics given.  Chest x-ray without acute findings.  Urinalysis shows slight infection.  Will admit to the hospital service Final Clinical Impression(s) / ED Diagnoses Final diagnoses:  None    Rx / DC Orders ED Discharge Orders    None       Lorre Nick, MD 03/01/21 [phone]

## 2021-03-01 NOTE — ED Notes (Signed)
Purewick placed on pt. 

## 2021-03-01 NOTE — ED Notes (Signed)
Dr. Georg Ruddle at bedside.

## 2021-03-01 NOTE — Sepsis Progress Note (Signed)
Notified provider of need to order repeat lactic acid (3rd). .  

## 2021-03-01 NOTE — Progress Notes (Signed)
Pt unable to perform Flutter/Acapella Device.  No Flutter/Acapella left with Pt and she will not be charged.

## 2021-03-01 NOTE — ED Notes (Signed)
Dr. Janee Morn made aware of rectal temp. Warm blankets applied to pt.

## 2021-03-01 NOTE — Progress Notes (Addendum)
Civil engineer, contracting [facility])  Lindsey Grimes is [name] current hospice patient. She has a CTI of Protein Calorie Nutrition. She has been sent to the ED because the facility staff reported SOB and fever (100.2). EMS reports Rhonchi bilaterally. Neb treatments x 2 prior to arrival to the ED.  Patient was also given NS 300 ml Prior to arrival . She resides at [employer] [facility] facility. She will be admitted and treated with IV antibiotics for possible sepsis. This is a related admission.  Spoke with daughter who is just getting in town from [place]. She is aware patient will be admitted and I gave her the room number. She understands [facility] will follow this patient during hospitalization.   VS: 157/99, HR 58, 100RA O2, RR 13. Labs: ? Lactic 2.7  MD note: Patient's lactate is increasing here.  Code sepsis antibiotics given.  Chest x-ray without acute findings.  Urinalysis shows slight infection.  Will admit to the hospital service  Please feel free to call with any hospice related questions.  Yolande Jolly, BSN, [employer] [phone]

## 2021-03-01 NOTE — Progress Notes (Signed)
A consult was received from an ED physician for vancomycin & cefepime per pharmacy dosing.  The patient's profile has been reviewed for ht/wt/allergies/indication/available labs.   A one time order has been placed for cefepime 2 gm, vancomycin 1 gm, Flagyl 500 mg.    Further antibiotics/pharmacy consults should be ordered by admitting physician if indicated.                       Thank you,  Herby Abraham, Pharm.D 03/01/2021 1:27 PM

## 2021-03-01 NOTE — ED Triage Notes (Signed)
Per EMS- Patient is a resident of [facility]. Staff reported SOB and fever (100.2). EMS reports Rhonchi bilaterally. Neb treatments x 2 prior to arrival to the ED.  Patient was also given NS 300 ml Prior to arrival.

## 2021-03-02 LAB — CBC
HCT: 37 % (ref 36.0–46.0)
Hemoglobin: 11.6 g/dL — ABNORMAL LOW (ref 12.0–15.0)
MCH: 31.6 pg (ref 26.0–34.0)
MCHC: 31.4 g/dL (ref 30.0–36.0)
MCV: 100.8 fL — ABNORMAL HIGH (ref 80.0–100.0)
Platelets: UNDETERMINED 10*3/uL (ref 150–400)
RBC: 3.67 MIL/uL — ABNORMAL LOW (ref 3.87–5.11)
RDW: 13.2 % (ref 11.5–15.5)
WBC: 7.6 10*3/uL (ref 4.0–10.5)
nRBC: 0 % (ref 0.0–0.2)

## 2021-03-02 LAB — CBC WITH DIFFERENTIAL/PLATELET
Abs Immature Granulocytes: 0.01 10*3/uL (ref 0.00–0.07)
Basophils Absolute: 0 10*3/uL (ref 0.0–0.1)
Basophils Relative: 0 %
Eosinophils Absolute: 0 10*3/uL (ref 0.0–0.5)
Eosinophils Relative: 0 %
HCT: 36.5 % (ref 36.0–46.0)
Hemoglobin: 11.4 g/dL — ABNORMAL LOW (ref 12.0–15.0)
Immature Granulocytes: 0 %
Lymphocytes Relative: 16 %
Lymphs Abs: 1.2 10*3/uL (ref 0.7–4.0)
MCH: 31.2 pg (ref 26.0–34.0)
MCHC: 31.2 g/dL (ref 30.0–36.0)
MCV: 100 fL (ref 80.0–100.0)
Monocytes Absolute: 0.6 10*3/uL (ref 0.1–1.0)
Monocytes Relative: 8 %
Neutro Abs: 5.7 10*3/uL (ref 1.7–7.7)
Neutrophils Relative %: 76 %
Platelets: 146 10*3/uL — ABNORMAL LOW (ref 150–400)
RBC: 3.65 MIL/uL — ABNORMAL LOW (ref 3.87–5.11)
RDW: 13.1 % (ref 11.5–15.5)
WBC: 7.6 10*3/uL (ref 4.0–10.5)
nRBC: 0 % (ref 0.0–0.2)

## 2021-03-02 LAB — BASIC METABOLIC PANEL
Anion gap: 6 (ref 5–15)
Anion gap: 8 (ref 5–15)
BUN: 11 mg/dL (ref 8–23)
BUN: 12 mg/dL (ref 8–23)
CO2: 30 mmol/L (ref 22–32)
CO2: 26 mmol/L (ref 22–32)
Calcium: 8.2 mg/dL — ABNORMAL LOW (ref 8.9–10.3)
Calcium: 8.3 mg/dL — ABNORMAL LOW (ref 8.9–10.3)
Chloride: 102 mmol/L (ref 98–111)
Chloride: 103 mmol/L (ref 98–111)
Creatinine, Ser: 0.36 mg/dL — ABNORMAL LOW (ref 0.44–1.00)
Creatinine, Ser: <0.3 mg/dL — ABNORMAL LOW (ref 0.44–1.00)
GFR, Estimated: >60 mL/min (ref >60)
Glucose, Bld: 93 mg/dL (ref 70–99)
Glucose, Bld: 96 mg/dL (ref 70–99)
Potassium: 4.1 mmol/L (ref 3.5–5.1)
Potassium: 4.3 mmol/L (ref 3.5–5.1)
Sodium: 138 mmol/L (ref 135–145)
Sodium: 137 mmol/L (ref 135–145)

## 2021-03-02 LAB — LACTIC ACID, PLASMA: Lactic Acid, Venous: 1.2 mmol/L (ref 0.5–1.9)

## 2021-03-02 LAB — MAGNESIUM: Magnesium: 1.7 mg/dL (ref 1.7–2.4)

## 2021-03-02 MED ORDER — SORBITOL 70 % SOLN
30.0000 mL | Freq: Every day | Status: DC | PRN
  Filled 2021-03-02: qty 30

## 2021-03-02 MED ORDER — MAGNESIUM CITRATE PO SOLN: 1.0000 | Freq: Once | ORAL | Status: DC | PRN

## 2021-03-02 MED ORDER — SODIUM CHLORIDE 0.9 % IV SOLN
500.0000 mg | INTRAVENOUS | Status: DC
  Administered 2021-03-02 – 2021-03-03 (×2): 500 mg via INTRAVENOUS
  Filled 2021-03-02 (×3): qty 500

## 2021-03-02 MED ORDER — GUAIFENESIN ER 600 MG PO TB12: 600.0000 mg | ORAL_TABLET | Freq: Two times a day (BID) | ORAL | Status: DC

## 2021-03-02 MED ORDER — IPRATROPIUM-ALBUTEROL 0.5-2.5 (3) MG/3ML IN SOLN
3.0000 mL | Freq: Two times a day (BID) | RESPIRATORY_TRACT | Status: DC
  Administered 2021-03-02 – 2021-03-04 (×4): 3 mL via RESPIRATORY_TRACT
  Filled 2021-03-02 (×4): qty 3

## 2021-03-02 MED ORDER — SENNOSIDES-DOCUSATE SODIUM 8.6-50 MG PO TABS: 1.0000 | ORAL_TABLET | Freq: Every evening | ORAL | Status: DC | PRN

## 2021-03-02 MED ORDER — MAGNESIUM SULFATE 2 GM/50ML IV SOLN
2.0000 g | Freq: Once | INTRAVENOUS | Status: AC
  Administered 2021-03-02: 2 g via INTRAVENOUS
  Filled 2021-03-02: qty 50

## 2021-03-02 MED ORDER — ACETAMINOPHEN 650 MG RE SUPP: 650.0000 mg | Freq: Four times a day (QID) | RECTAL | Status: DC | PRN

## 2021-03-02 MED ORDER — ONDANSETRON HCL 4 MG PO TABS: 4.0000 mg | ORAL_TABLET | Freq: Four times a day (QID) | ORAL | Status: DC | PRN

## 2021-03-02 MED ORDER — SODIUM CHLORIDE 0.9% FLUSH
3.0000 mL | Freq: Two times a day (BID) | INTRAVENOUS | Status: DC
  Administered 2021-03-02 – 2021-03-04 (×5): 3 mL via INTRAVENOUS

## 2021-03-02 MED ORDER — ENOXAPARIN SODIUM 30 MG/0.3ML ~~LOC~~ SOLN
30.0000 mg | SUBCUTANEOUS | Status: DC
  Administered 2021-03-02 – 2021-03-03 (×2): 30 mg via SUBCUTANEOUS
  Filled 2021-03-02 (×3): qty 0.3

## 2021-03-02 MED ORDER — ONDANSETRON HCL 4 MG/2ML IJ SOLN: 4.0000 mg | Freq: Four times a day (QID) | INTRAMUSCULAR | Status: DC | PRN

## 2021-03-02 MED ORDER — ACETAMINOPHEN 325 MG PO TABS: 650.0000 mg | ORAL_TABLET | Freq: Four times a day (QID) | ORAL | Status: DC | PRN

## 2021-03-02 NOTE — Progress Notes (Signed)
Patient's daughter requested to stay overnight. [facility]) notified and approved the request considering patient's age and mental status.

## 2021-03-02 NOTE — Progress Notes (Addendum)
[id number] -  Civil engineer, contracting [facility]) - Hospitalized Hospice patient visit:    Lindsey Grimes is a hospice patient with an ACC terminal diagnosis of protein calorie malnutrition. Patient resides at [facility] with hospice support and was sent to the ED for evaluation of SOB/fever per family request. [facility] RN arrived at facility as EMS was administering a neb breathing treatment reporting bilateral rhonchi/SOB and a fever of 101.1.  Patient transported to the [facility] ED for evaluation and was admitted on 03/01/21 with Fever/SOB/possible sepsis. This is a related admission. Patient is a DNR.   Checked in with bedside RN, who reported that patient is comfortable, afebrile today and may transfer out of stepdown unit this afternoon if remains medically stable. Visited patient in room with daughter at side. Patient resting with eyes closed, no response to verbal command in NAD. Daughter is concerned about mother stating that patient at baseline is usually up in a wheelchair - very social and talkative. Supported daughter and encouraged to express feelings - notifying her that an ACC HLT will continue to follow patient daily during her hospitalization. Family has ACC contact information to call if needed.     V/S:  97.6, 136/104, 71, 18 with sats 100% on 5L/min O2 via [place] I&O: + 1172.48m intake (no output noted) last 24 hours Abnormal lab work: create 0.36, Cal 8.2, Glucap 114 Diagnostics:  Chest x-ray on 4/18 unremarkable, Urinalysis with trace leukocytes/6-10 WBCs, CT angio chest - neg for PE but concerning for mucus impaction/possible bronchopneumonia IVs/PRNs: ADM Iv vancomycin, IV cefepime Iv flagyl in ED. Cefepime 2g in IVPB to be adm q24hr.     MD EPIC Problem list:  -PNA (pneumonia) :pending Blood Cx, continue empiric IV cefepime, scheduled duonebs, Mucinex, Claritin, pepcid -Dehydration - improving with gentle hydration   D/C planning- Ongoing - plan is to discharge back to SNF/Nursing Facility with  support of hospice once medically stable. Please use [facility] for all hospice patient transportation needs at discharge.  Goals of Care: Clear - DNR. Family wishes to continue hospice support upon discharge. Communication with IDT- Updated ACC team, AOR made aware of hospitalization. Communication with PCG - supported daughter at bedside.   Please call with any hospice related questions/concerns,   Roda Shutters, RN [facility] Liaison (in [place]) [phone]

## 2021-03-02 NOTE — Evaluation (Signed)
Clinical/Bedside Swallow Evaluation Patient Details  Name: Lindsey Grimes MRN: [id number] Date of Birth: 12/19/1916  Today's Date: 03/02/2021 Time: SLP Start Time (ACUTE ONLY): 1245 SLP Stop Time (ACUTE ONLY): 1305 SLP Time Calculation (min) (ACUTE ONLY): 20 min  Past Medical History:  Past Medical History:  Diagnosis Date  . Anxiety   . Chronic constipation   . Chronic nasal congestion   . Degenerative disc disease   . Hyperlipidemia   . Multiple sclerosis (HCC)    Past Surgical History: History reviewed. No pertinent surgical history. HPI:  Per MD notes "Patient pleasant [age]-year-old female history of MS, chronic constipation, hyperlipidemia, degenerative disc disease, anxiety, resident nursing facility followed by hospice presented to the ED with shortness of breath, fever.  Patient initially had elevated lactic acid level that trended down.  Urinalysis with trace leukocytes, nitrite negative, 6-10 WBCs.  COVID-19 PCR negative.  Influenza AMB PCR negative.  Chest x-ray done unremarkable.  CT angiogram chest done negative for PE but concerning for mucus impaction/possible mild bronchopneumonia.  Patient pancultured.  Patient placed empirically on IV antibiotics.  Patient noted to be hypothermic in the ED had to be placed on the bair hugger and as such had to be admitted to the stepdown unit." MBS as OP 2018  Pt has a cervical esophageal dysphagia with a prominent CP segment that impedes bolus flow with all consistencies. Backflow into the pharynx is observed that increases with more solid textures. Intermittent silent penetration occurs with thin liquids, which mostly clears with a cued cough. No penetration occurs with nectar thick liquids. In general, the liquids assist in clearing the cervical esophagus, but with multiple boluses needed. Recommended regular/thin diet.   Assessment / Plan / Recommendation Clinical Impression  Pt with appearance of minimal left facial asymmetry compared to  right - otherwise no focal CN deficits.  Her voice subjectively appears hoarse and pt states it's baseline.  She denies awareness of facial asymmetry and denies dysphagia.  SLP observed pt consuming room temperature water, nectar thick apple juice, applesauce and graham crackers. She was able to hold her own small cup for feeding.  Audible multiple swallows followed by belching noted with liquids - but pt denies refluxing.  Subtle cough noted x1 after 2nd bolus of graham cracker  swallowed - but pt denies coorelated to po intake.  Her RR and oxygen saturation remained stable.  Pt is known to have h/o cervical esophageal dysphagia diagnosed in 2018 as an OP with reg/thin diet advised with general precautions - including occasional cough due to silent laryngeal penetration. Pt does not present with significant clinical signs of dysphagia and given her advanced age, pt denial of dysphagia - current diet regimen with precautions advised.  SLP advised pt that silent aspiration can not be ruled out clinically and pt reported understanding. She advised she is a "good eater" and has "good teeth".  All education completed for dysphagia mitigation.  No Follow up needed - pt informed to prior testing results and current recommendations using teach back and written strategies. SLP Visit Diagnosis: Dysphagia, pharyngoesophageal phase (R13.14)    Aspiration Risk  Mild aspiration risk    Diet Recommendation Dysphagia 3 (Mech soft);Thin liquid   Liquid Administration via: Straw;Cup Medication Administration: Crushed with puree Supervision: Full supervision/cueing for compensatory strategies Compensations: Slow rate;Small sips/bites;Other (Comment) (intermittent throat clearing/cough) Postural Changes: Remain upright for at least 30 minutes after po intake;Seated upright at 90 degrees    Other  Recommendations Oral Care Recommendations:  Oral care BID   Follow up Recommendations None      Frequency and Duration    n/a         Prognosis   n/a     Swallow Study   General HPI: Per MD notes "Patient pleasant [age]-year-old female history of MS, chronic constipation, hyperlipidemia, degenerative disc disease, anxiety, resident nursing facility followed by hospice presented to the ED with shortness of breath, fever.  Patient initially had elevated lactic acid level that trended down.  Urinalysis with trace leukocytes, nitrite negative, 6-10 WBCs.  COVID-19 PCR negative.  Influenza AMB PCR negative.  Chest x-ray done unremarkable.  CT angiogram chest done negative for PE but concerning for mucus impaction/possible mild bronchopneumonia.  Patient pancultured.  Patient placed empirically on IV antibiotics.  Patient noted to be hypothermic in the ED had to be placed on the bair hugger and as such had to be admitted to the stepdown unit." MBS as OP 2018  Pt has a cervical esophageal dysphagia with a prominent CP segment that impedes bolus flow with all consistencies. Backflow into the pharynx is observed that increases with more solid textures. Intermittent silent penetration occurs with thin liquids, which mostly clears with a cued cough. No penetration occurs with nectar thick liquids. In general, the liquids assist in clearing the cervical esophagus, but with multiple boluses needed. Recommended regular/thin diet. Type of Study: Bedside Swallow Evaluation Diet Prior to this Study: Dysphagia 3 (soft);Thin liquids Temperature Spikes Noted: No Respiratory Status: Nasal cannula History of Recent Intubation: No Behavior/Cognition: Alert;Cooperative Oral Cavity Assessment: Within Functional Limits Oral Care Completed by SLP: No Oral Cavity - Dentition: Adequate natural dentition Vision: Functional for self-feeding Self-Feeding Abilities: Needs assist Patient Positioning: Upright in bed;Other (comment) (reverse trendelenburg) Baseline Vocal Quality: Normal Volitional Cough: Weak Volitional Swallow: Able to elicit     Oral/Motor/Sensory Function Overall Oral Motor/Sensory Function: Other (comment) (? minimal decreased facial-labial symmetry on left compared to right)   Ice Chips Ice chips: Not tested Other Comments: pt does not like ice chips   Thin Liquid Thin Liquid: Impaired Presentation: Cup;Straw;Self Fed;Spoon Pharyngeal  Phase Impairments: Multiple swallows    Nectar Thick Nectar Thick Liquid: Within functional limits Presentation: Cup;Self Fed   Honey Thick Honey Thick Liquid: Not tested   Puree Puree: Within functional limits Presentation: Spoon   Solid     Solid: Impaired Pharyngeal Phase Impairments: Multiple swallows;Throat Clearing - Delayed      Chales Abrahams 03/02/2021,1:51 PM   Rolena Infante, MS [facility] SLP Acute Rehab Services Office [phone] Pager [phone]

## 2021-03-02 NOTE — TOC Initial Note (Signed)
Transition of Care [facility]) - Initial/Assessment Note    Patient Details  Name: Lindsey Grimes MRN: [id number] Date of Birth: 07/13/1917  Transition of Care [facility]) CM/SW Contact:    Golda Acre, RN Phone Number: 03/02/2021, 8:33 AM  Clinical Narrative:                 87 y.o. female with medical history significant of multiple sclerosis, chronic constipation, hyperlipidemia, degenerative disc disease, anxiety, resident at nursing facility, being followed by hospice who presented to the ED from nursing facility with shortness of breath, fever (100.2).  Per ED report EMS noted rhonchi bilaterally patient received neb treatments x2 prior to arrival in the ED and given 300 cc of normal saline. History obtained from ED physician and also partly from patient.  Per ED physician patient with decreased oral intake for several days noted to be febrile with a productive cough. Patient denies any recent fevers, no chills, no nausea, no vomiting, no chest pain, no shortness of breath, no abdominal pain, no diarrhea, no constipation, no melena, no hematemesis, no hematochezia, no syncopal episode.  Patient denies any significant decreased oral intake.  ED Course: Patient seen in the ED initially noted to have a lactic acid level of 2.0 which went up to 2.7.  Urinalysis done trace leukocytes, nitrite negative, 6-10 WBCs, specific gravity of 1.020.  Comprehensive metabolic profile done with a glucose of 140, calcium of 8.7 otherwise was within normal limits.  CBC was unremarkable.  SARS coronavirus 2 PCR negative.  Influenza A and B PCR negative.  Chest x-ray done unremarkable.  CT angiogram chest done, negative for PE, new distal airway mucus impaction and patchy densities in the posterior lingula may reflect atelectasis versus mild bronchopneumonia.  New 4 mm pulmonary nodule in the medial aspect of the left lower lobe, no further follow-up suggested due to patient age.  Aortic atherosclerosis.  Patient placed  on IV fluids, given IV cefepime, IV Flagyl, IV vancomycin in the ED.  1 set of blood cultures obtained.  Urine cultures pending. Hospitalist were called to admit the patient for further evaluation and management. PLAN to return to [facility] where pt is a resident of. Expected Discharge Plan: Home w Hospice Care Barriers to Discharge: Continued Medical Work up   Patient Goals and CMS Choice Patient states their goals for this hospitalization and ongoing recovery are:: not stated at this time   Choice offered to / list presented to : Patient  Expected Discharge Plan and Services Expected Discharge Plan: Home w Hospice Care   Discharge Planning Services: CM Consult   Living arrangements for the past 2 months: Assisted Living Facility                                      Prior Living Arrangements/Services Living arrangements for the past 2 months: Assisted Living Facility Lives with:: Facility Resident Patient language and need for interpreter reviewed:: Yes Do you feel safe going back to the place where you live?: Yes      Need for Family Participation in Patient Care: Yes (Comment) Care giver support system in place?: Yes (comment)   Criminal Activity/Legal Involvement Pertinent to Current Situation/Hospitalization: No - Comment as needed  Activities of Daily Living Home Assistive Devices/Equipment: Walker (specify type),Wheelchair,Reacher,Grab bars around toilet,Grab bars in shower,Hand-held shower hose,Blood pressure cuff,Scales,Other (Comment) (bunny boots at night) ADL Screening (condition at time  of admission) Patient's cognitive ability adequate to safely complete daily activities?: No Is the patient deaf or have difficulty hearing?: No Does the patient have difficulty seeing, even when wearing glasses/contacts?: Yes Does the patient have difficulty concentrating, remembering, or making decisions?: Yes Patient able to express need for assistance with ADLs?:  Yes Does the patient have difficulty dressing or bathing?: Yes Independently performs ADLs?: No Communication: Independent Dressing (OT): Dependent Is this a change from baseline?: Change from baseline, expected to last >3 days Grooming: Dependent Is this a change from baseline?: Change from baseline, expected to last >3 days Feeding: Dependent Is this a change from baseline?: Change from baseline, expected to last >3 days Bathing: Dependent Is this a change from baseline?: Change from baseline, expected to last >3 days Toileting: Dependent Is this a change from baseline?: Change from baseline, expected to last >3days In/Out Bed: Dependent Is this a change from baseline?: Change from baseline, expected to last >3 days Walks in Home: Dependent Is this a change from baseline?: Change from baseline, expected to last >3 days Does the patient have difficulty walking or climbing stairs?: Yes (secondary to shortness of breath and weakness) Weakness of Legs: Both Weakness of Arms/Hands: Both  Permission Sought/Granted                  Emotional Assessment Appearance:: Appears stated age Attitude/Demeanor/Rapport: Engaged Affect (typically observed): Calm Orientation: : Oriented to Self,Oriented to Place,Oriented to  Time,Oriented to Situation Alcohol / Substance Use: Not Applicable Psych Involvement: No (comment)  Admission diagnosis:  Fever [R50.9] Dyspnea, unspecified type [R06.00] Patient Active Problem List   Diagnosis Date Noted  . Fever 03/01/2021  . PNA (pneumonia) : Probable 03/01/2021  . Dehydration 03/01/2021  . Hypothermia 03/01/2021  . Fall 03/07/2014  . Right scapula fracture 03/07/2014  . Multiple rib fractures 03/06/2014   PCP:  Audree Bane, DO Pharmacy:  No Pharmacies Listed    Social Determinants of Health (SDOH) Interventions    Readmission Risk Interventions No flowsheet data found.

## 2021-03-02 NOTE — Progress Notes (Addendum)
PROGRESS NOTE    Lindsey Grimes  DUK:[id number] DOB: [birth year] DOA: 03/01/2021 PCP: Audree Bane, DO    Chief Complaint  Patient presents with  . Shortness of Breath  . Fever    Brief Narrative:  Patient pleasant [age]-year-old female history of MS, chronic constipation, hyperlipidemia, degenerative disc disease, anxiety, resident nursing facility followed by hospice presented to the ED with shortness of breath, fever.  Patient initially had elevated lactic acid level that trended down.  Urinalysis with trace leukocytes, nitrite negative, 6-10 WBCs.  COVID-19 PCR negative.  Influenza AMB PCR negative.  Chest x-ray done unremarkable.  CT angiogram chest done negative for PE but concerning for mucus impaction/possible mild bronchopneumonia.  Patient pancultured.  Patient placed empirically on IV antibiotics.  Patient noted to be hypothermic in the ED had to be placed on the bair hugger and as such had to be admitted to the stepdown unit.    Assessment & Plan:   Principal Problem:   Fever Active Problems:   PNA (pneumonia) : Probable   Dehydration   Hypothermia  1 fever/hypothermia likely secondary to pneumonia -Patient brought to the ED due to concerns for fever, shortness of breath, productive cough.  Chest x-ray done unremarkable.  1 set of blood cultures ordered in the ED and pending.  Urine cultures pending.    Blood cultures pending. -  CT angiogram chest done concerning for mucous plugging versus bronchopneumonia.  Lactic acid noted to be elevated at 2.0 and going up to 2.7 and trended back down to 1.2.Marland Kitchen   -CBC with no leukocytosis noted.   -Noted to be hypothermic per RN in the ED while awaiting bed placement with a rectal temperature of 96.5.   -Patient placed on Bair hugger with improvement with hypothermia.   -Urine Legionella antigen, urine pneumococcus antigen pending.   -MRSA PCR negative.   -Patient received a dose of IV vancomycin, IV cefepime, IV Flagyl in the  ED. -Continue empiric IV cefepime, IV Azithromycin, scheduled DuoNebs, Mucinex, Claritin, Pepcid.   -Supportive care.   2.  Probable pneumonia -CT angiogram chest done concerning for mucous plugging versus probably pneumonia.  It was noted per ED please note that patient had presented from facility with shortness of breath. -Urine Legionella antigen, urine pneumococcus antigen pending  Check urine Legionella antigen.  Check urine pneumococcus antigen.  -MRSA PCR screening negative. -SLP evaluation pending. -Continue Mucinex,  IV cefepime, scheduled DuoNebs, Claritin, Pepcid.  Pulmonary toilet. -Supportive care.  3.  Dehydration -Improving with gentle hydration.    4.  Chronic constipation -Continue Senokot-S twice daily.   DVT prophylaxis: Lovenox Code Status: DNR Family Communication: Updated patient.  No family at bedside Disposition:   Status is: Inpatient    Dispo: The patient is from: SNF/nursing facility              Anticipated d/c is to: Back to SNF/nursing facility with hospice following              Patient currently on IV antibiotics for probable pneumonia.  Not stable for discharge.   Difficult to place patient no       Consultants:   None  Procedures:   Chest x-ray 03/01/2021  CT angiogram chest 03/01/2021  Antimicrobials:   IV cefepime 03/01/2021>>>>  IV Azithromycin 03/01/2021>>>   Subjective: Sitting up in bed. No CP. No SOB. No nausea or emesis. Per RN off bair hugger over night.  Per RN patient went sleeping desatted and had to be  placed on 7 L nasal cannula.  Currently on 5 L nasal cannula with sats of 100%.  Objective: Vitals:   03/02/21 0500 03/02/21 0600 03/02/21 0800 03/02/21 0806  BP: (!) 148/64 (!) 135/47 (!) 136/104   Pulse: (!) 55 (!) 55 71   Resp: 14 15 18    Temp:   (!) 97.5 F (36.4 C)   TempSrc:   Axillary   SpO2: 98%  100% 100%  Weight:      Height:        Intake/Output Summary (Last 24 hours) at 03/02/2021 0908 Last  data filed at 03/02/2021 0542 Gross per 24 hour  Intake 1172.14 ml  Output --  Net 1172.14 ml   Filed Weights   03/01/21 1033 03/01/21 2100  Weight: 40.8 kg 34.5 kg    Examination:  General exam: Appears calm and comfortable.  Kyphosis Respiratory system: Some decreased breath sounds in the bases.  No crackles.  No rhonchi.  No use of accessory muscles of respiration.  Cardiovascular system: S1 & S2 heard, RRR. No JVD, murmurs, rubs, gallops or clicks. No pedal edema. Gastrointestinal system: Abdomen is nondistended, soft and nontender. No organomegaly or masses felt. Normal bowel sounds heard. Central nervous system: Alert and oriented. No focal neurological deficits. Extremities: Right lower extremity with some contracture.   Skin: No rashes, lesions or ulcers Psychiatry: Judgement and insight appear normal. Mood & affect appropriate.     Data Reviewed: I have personally reviewed following labs and imaging studies  CBC: Recent Labs  Lab 03/01/21 1056  WBC 9.6  NEUTROABS 7.5  HGB 13.4  HCT 42.9  MCV 99.3  PLT 159    Basic Metabolic Panel: Recent Labs  Lab 03/01/21 1056 03/02/21 0428 03/02/21 0824  NA 141  --  138  K 3.8  --  4.1  CL 102  --  102  CO2 29  --  30  GLUCOSE 140*  --  93  BUN 20  --  11  CREATININE 0.56  --  0.36*  CALCIUM 8.7*  --  8.2*  MG  --  1.7  --     GFR: Estimated Creatinine Clearance: 19.9 mL/min (A) (by C-G formula based on SCr of 0.36 mg/dL (L)).  Liver Function Tests: Recent Labs  Lab 03/01/21 1056  AST 19  ALT 11  ALKPHOS 79  BILITOT 0.3  PROT 7.0  ALBUMIN 3.9    CBG: Recent Labs  Lab 03/01/21 1630  GLUCAP 114*     Recent Results (from the past 240 hour(s))  Blood culture (routine single)     Status: None (Preliminary result)   Collection Time: 03/01/21 10:56 AM   Specimen: Site Not Specified; Blood  Result Value Ref Range Status   Specimen Description   Final    SITE NOT SPECIFIED Performed at [facility], [address] [place], [place]    Special Requests   Final    BOTTLES DRAWN AEROBIC AND ANAEROBIC Blood Culture results may not be optimal due to an inadequate volume of blood received in culture bottles Performed at [facility], [address] [place], [place]    Culture   Final    NO GROWTH < 12 HOURS Performed at [facility], [address], [place], [place]    Report Status PENDING  Incomplete  Resp Panel by RT-PCR (Flu A&B, Covid) Nasopharyngeal Swab     Status: None   Collection Time: 03/01/21  11:08 AM   Specimen: Nasopharyngeal Swab; Nasopharyngeal(NP) swabs in vial transport medium  Result Value Ref Range Status   SARS Coronavirus 2 by RT PCR NEGATIVE NEGATIVE Final    Comment: (NOTE) SARS-CoV-2 target nucleic acids are NOT DETECTED.  The SARS-CoV-2 RNA is generally detectable in upper respiratory specimens during the acute phase of infection. The lowest concentration of SARS-CoV-2 viral copies this assay can detect is 138 copies/mL. A negative result does not preclude SARS-Cov-2 infection and should not be used as the sole basis for treatment or other patient management decisions. A negative result may occur with  improper specimen collection/handling, submission of specimen other than nasopharyngeal swab, presence of viral mutation(s) within the areas targeted by this assay, and inadequate number of viral copies(<138 copies/mL). A negative result must be combined with clinical observations, patient history, and epidemiological information. The expected result is Negative.  Fact Sheet for Patients:  BloggerCourse.com  Fact Sheet for Healthcare Providers:  SeriousBroker.it  This test is no t yet approved or cleared by the [place] FDA and  has been authorized for detection and/or diagnosis of SARS-CoV-2 by FDA under an Emergency Use Authorization  (EUA). This EUA will remain  in effect (meaning this test can be used) for the duration of the COVID-19 declaration under Section 564(b)(1) of the Act, 21 U.S.C.section 360bbb-3(b)(1), unless the authorization is terminated  or revoked sooner.       Influenza A by PCR NEGATIVE NEGATIVE Final   Influenza B by PCR NEGATIVE NEGATIVE Final    Comment: (NOTE) The Xpert Xpress SARS-CoV-2/FLU/RSV plus assay is intended as an aid in the diagnosis of influenza from Nasopharyngeal swab specimens and should not be used as a sole basis for treatment. Nasal washings and aspirates are unacceptable for Xpert Xpress SARS-CoV-2/FLU/RSV testing.  Fact Sheet for Patients: BloggerCourse.com  Fact Sheet for Healthcare Providers: SeriousBroker.it  This test is not yet approved or cleared by the [place] FDA and has been authorized for detection and/or diagnosis of SARS-CoV-2 by FDA under an Emergency Use Authorization (EUA). This EUA will remain in effect (meaning this test can be used) for the duration of the COVID-19 declaration under Section 564(b)(1) of the Act, 21 U.S.C. section 360bbb-3(b)(1), unless the authorization is terminated or revoked.  Performed at [facility], [address] [place], [place] [address]   Culture, blood (Routine X 2) w Reflex to ID Panel     Status: None (Preliminary result)   Collection Time: 03/01/21  5:16 PM   Specimen: BLOOD  Result Value Ref Range Status   Specimen Description   Final    BLOOD LEFT ANTECUBITAL Performed at [facility], [address] [place], [place] [address]    Special Requests   Final    BOTTLES DRAWN AEROBIC ONLY Blood Culture results may not be optimal due to an inadequate volume of blood received in culture bottles Performed at [facility], [address] [place], [place] [address]    Culture   Final    NO GROWTH < 12  HOURS Performed at [facility], [address] [place], [place] [address]    Report Status PENDING  Incomplete  MRSA PCR Screening     Status: None   Collection Time: 03/01/21  9:15 PM   Specimen: Nasopharyngeal  Result Value Ref Range Status   MRSA by PCR NEGATIVE NEGATIVE Final    Comment:        The GeneXpert MRSA  Assay (FDA approved for NASAL specimens only), is one component of a comprehensive MRSA colonization surveillance program. It is not intended to diagnose MRSA infection nor to guide or monitor treatment for MRSA infections. Performed at [facility], [address], [place], [place] [address]          Radiology Studies: CT Angio Chest PE W/Cm &/Or Wo Cm  Result Date: 03/01/2021 CLINICAL DATA:  Fever and shortness of breath. EXAM: CT ANGIOGRAPHY CHEST WITH CONTRAST TECHNIQUE: Multidetector CT imaging of the chest was performed using the standard protocol during bolus administration of intravenous contrast. Multiplanar CT image reconstructions and MIPs were obtained to evaluate the vascular anatomy. CONTRAST:  44mL OMNIPAQUE IOHEXOL 350 MG/ML SOLN COMPARISON:  Chest x-ray from same day. CT chest dated March 06, 2014. FINDINGS: Cardiovascular: Satisfactory opacification of the pulmonary arteries to the segmental level. No evidence of pulmonary embolism. Normal heart size. No pericardial effusion. No thoracic aortic aneurysm. Coronary, aortic arch, and branch vessel atherosclerotic vascular disease. Mediastinum/Nodes: No enlarged mediastinal, hilar, or axillary lymph nodes. Thyroid gland, trachea, and esophagus demonstrate no significant findings. Lungs/Pleura: Biapical pleuroparenchymal scarring in noted. Mild chronic bronchiectasis and peripheral scarring in the right middle lobe. New distal airways mucous impaction and patchy densities in the posterior lingula. Minimal subsegmental atelectasis in the left lower lobe. No focal consolidation, pleural  effusion, or pneumothorax. New 4 mm pulmonary nodule in the medial aspect of the left lower lobe (series 6, image 70). Upper Abdomen: No acute abnormality. Musculoskeletal: No chest wall abnormality. No acute or significant osseous findings. Multiple old right-sided rib fractures. Severe bilateral glenohumeral osteoarthritis. Exaggerated upper thoracic kyphosis. Review of the MIP images confirms the above findings. IMPRESSION: 1. No evidence of pulmonary embolism. 2. New distal airways mucous impaction and patchy densities in the posterior lingula may reflect atelectasis versus mild bronchopneumonia. 3. New 4 mm pulmonary nodule in the medial aspect of the left lower lobe. No further follow-up suggested due to patient age. 4. Aortic Atherosclerosis (ICD10-I70.0). Electronically Signed   By: Obie Dredge M.D.   On: 03/01/2021 15:24   DG Chest Port 1 View  Result Date: 03/01/2021 CLINICAL DATA:  Questionable sepsis EXAM: PORTABLE CHEST 1 VIEW COMPARISON:  12/15/2017 FINDINGS: Normal heart size and mediastinal contours. No acute infiltrate or edema. No effusion or pneumothorax. No acute osseous findings. IMPRESSION: Stable chest.  No visible pneumonia. Electronically Signed   By: Marnee Spring M.D.   On: 03/01/2021 10:56        Scheduled Meds: . Chlorhexidine Gluconate Cloth  6 each Topical Daily  . DULoxetine  30 mg Oral Daily  . enoxaparin (LOVENOX) injection  30 mg Subcutaneous Q24H  . famotidine  40 mg Oral QHS  . guaiFENesin  1,200 mg Oral BID  . guaiFENesin  600 mg Oral BID  . HYDROcodone-acetaminophen  0.5 tablet Oral TID  . ipratropium-albuterol  3 mL Nebulization BID  . loratadine  10 mg Oral Daily  . mouth rinse  15 mL Mouth Rinse BID  . senna-docusate  1 tablet Oral BID  . sodium chloride flush  3 mL Intravenous Q12H  . Vitamin D (Ergocalciferol)  50,000 Units Oral Q7 days   Continuous Infusions: . azithromycin    . ceFEPime (MAXIPIME) IV    . magnesium sulfate bolus IVPB  Stopped (03/02/21 0920)     LOS: 1 day    Time spent: 40 minutes    Ramiro Harvest, MD [facility]   To contact the attending  provider between 7A-7P or the covering provider during after hours 7P-7A, please log into the web site [email or web address] and access using universal [place] password for that web site. If you do not have the password, please call the hospital operator.  03/02/2021, 9:08 AM

## 2021-03-03 ENCOUNTER — Inpatient Hospital Stay (HOSPITAL_COMMUNITY)

## 2021-03-03 DIAGNOSIS — R509 Fever, unspecified: Secondary | ICD-10-CM

## 2021-03-03 DIAGNOSIS — D649 Anemia, unspecified: Secondary | ICD-10-CM

## 2021-03-03 DIAGNOSIS — E860 Dehydration: Secondary | ICD-10-CM

## 2021-03-03 DIAGNOSIS — D696 Thrombocytopenia, unspecified: Secondary | ICD-10-CM

## 2021-03-03 DIAGNOSIS — T68XXXD Hypothermia, subsequent encounter: Secondary | ICD-10-CM

## 2021-03-03 DIAGNOSIS — J189 Pneumonia, unspecified organism: Secondary | ICD-10-CM

## 2021-03-03 LAB — CBC WITH DIFFERENTIAL/PLATELET
Abs Immature Granulocytes: 0.02 10*3/uL (ref 0.00–0.07)
Basophils Absolute: 0 10*3/uL (ref 0.0–0.1)
Basophils Relative: 0 %
Eosinophils Absolute: 0 10*3/uL (ref 0.0–0.5)
Eosinophils Relative: 0 %
HCT: 36.5 % (ref 36.0–46.0)
Hemoglobin: 11.9 g/dL — ABNORMAL LOW (ref 12.0–15.0)
Immature Granulocytes: 0 %
Lymphocytes Relative: 12 %
Lymphs Abs: 1 10*3/uL (ref 0.7–4.0)
MCH: 31.4 pg (ref 26.0–34.0)
MCHC: 32.6 g/dL (ref 30.0–36.0)
MCV: 96.3 fL (ref 80.0–100.0)
Monocytes Absolute: 0.6 10*3/uL (ref 0.1–1.0)
Monocytes Relative: 8 %
Neutro Abs: 6.3 10*3/uL (ref 1.7–7.7)
Neutrophils Relative %: 80 %
Platelets: 144 10*3/uL — ABNORMAL LOW (ref 150–400)
RBC: 3.79 MIL/uL — ABNORMAL LOW (ref 3.87–5.11)
RDW: 12.9 % (ref 11.5–15.5)
WBC: 7.9 10*3/uL (ref 4.0–10.5)
nRBC: 0 % (ref 0.0–0.2)

## 2021-03-03 LAB — BASIC METABOLIC PANEL
Anion gap: 8 (ref 5–15)
BUN: 9 mg/dL (ref 8–23)
CO2: 29 mmol/L (ref 22–32)
Calcium: 8.1 mg/dL — ABNORMAL LOW (ref 8.9–10.3)
Chloride: 100 mmol/L (ref 98–111)
Creatinine, Ser: 0.37 mg/dL — ABNORMAL LOW (ref 0.44–1.00)
GFR, Estimated: >60 mL/min (ref >60)
Glucose, Bld: 89 mg/dL (ref 70–99)
Potassium: 3.8 mmol/L (ref 3.5–5.1)
Sodium: 137 mmol/L (ref 135–145)

## 2021-03-03 LAB — MAGNESIUM: Magnesium: 1.9 mg/dL (ref 1.7–2.4)

## 2021-03-03 LAB — URINE CULTURE

## 2021-03-03 MED ORDER — FAMOTIDINE 20 MG PO TABS
20.0000 mg | ORAL_TABLET | Freq: Every day | ORAL | Status: DC
  Administered 2021-03-03: 20 mg via ORAL
  Filled 2021-03-03: qty 1

## 2021-03-03 NOTE — Progress Notes (Signed)
Chaplain engaged in an initial visit with Lindsey Grimes.  Chaplain checked in, providing space for Efrat to talk.  She asked several questions around what she needed to do, where she would be sleeping, and about a Chest X-Ray procedure.  Chaplain provided calming presence and listening.  Chaplain let Anapaola know she didn't need to do anything.  Chaplain offered support.    03/03/21 1200  Clinical Encounter Type  Visited With Patient  Visit Type Initial

## 2021-03-03 NOTE — Progress Notes (Signed)
[facility] -  Civil engineer, contracting [facility]) - Hospitalized Hospice patient visit:   Lindsey Grimes is a hospice patient with an ACC terminal diagnosis of protein calorie malnutrition. Patient resides at [facility] with hospice support and was sent to the ED for evaluation of SOB/fever per family request. [facility] RN arrived at facility as EMS was administering a neb breathing treatment reporting bilateral rhonchi/SOB and a fever of 101.1.  Patient transported to the [facility] ED for evaluation and was admitted on 03/01/21 with Fever/SOB/possible sepsis. This is a related admission. Patient is a DNR.  Visited at bedside. No family present. Report received from nurse. Patient is alert and confused. No apparent distress. Nurse reports she ate well today, about 50% of meals.    VS: 97.7, 140/75, 76, 16, 94% ib 3Lnc I/O: 290/-- Abn Labs:  03/03/2021 05:16 Creatinine: 0.37 (L) Calcium: 8.1 (L) RBC: 3.79 (L) Hemoglobin: 11.9 (L) Platelets: 144 (L) MD EPIC Problem list:  -PNA (pneumonia) :pending Blood Cx, continue empiric IV cefepime, scheduled duonebs, Mucinex, Claritin, pepcid -Dehydration - improving with gentle hydration  D/C planning- Ongoing - plan is to discharge back to SNF/Nursing Facility with support of hospice once medically stable. Please use [facility] for all hospice patient transportation needs at discharge.  Goals of Care: Clear - DNR. Family wishes to continue hospice support upon discharge. Communication with IDT- Updated ACC team, AOR made aware of hospitalization. Communication with [facility] - left message  If you have questions or need assistance, please call [phone] or contact the hospital Liaison listed on AMION.      Elsie Saas, RN, [facility] Liaison

## 2021-03-03 NOTE — Progress Notes (Addendum)
PROGRESS NOTE    Lindsey Grimes Tri  ZOX:[id number] DOB: September 19, 1917 DOA: 03/01/2021 PCP: Audree BaneKing, Peter, DO   Brief Narrative: Patient is a 87 year old elderly cachectic Caucasian female with past medical history significant for but not limited to MS, chronic constipation, hyperlipidemia, degenerative disc disease, anxiety as well as other comorbidities who currently resides at [facility] and followed by hospice and presented to the ED with shortness of breath and fever.  Initially had elevated lactic acid levels which were then trended down.  Urinalysis showed trace leukocytes, negative nitrites, 6-10 WBCs.  COVID-19 PCR was negative.  Influenza A and B PCR were negative at that time.  Chest x-ray done was unremarkable but a CTA of the chest done negative for PE but concerning for mucus impaction and possible mild bronchopneumonia.  Patient was pancultured admitted and started on empiric IV antibiotics.  Given that she was hypothermic in the ED she was placed on Bair hugger and admitted to the stepdown unit.  Subsequently she improved has been afebrile since then and remains on the medical floor.  SLP initially recommended dysphagia 3 thin liquids but then diet was upgraded to dysphagia 1 diet.  Anticipating discharging back to the facility with hospice when she is clinically improved and afebrile for over 24 hours.  Assessment & Plan:   Principal Problem:   Fever Active Problems:   PNA (pneumonia) : Probable   Dehydration   Hypothermia  Fever and hypothermia likely secondary to a bronchopneumonia, present on admission Sepsis Ruled Out -He brought to the ED due to concerns for fever shortness of breath as well as productive cough -Had an elevated lactic acid level of 2.7 and this trended down to 1.2 -Blood cultures x2 show no growth to date and 1 day -Repeat chest x-ray ordered for this morning and pending -Urinalysis on admission showed a clear appearance with negative hemoglobin, negative  ketones, trace leukocytes, negative nitrites, rare bacteria, 0-5 RBCs per high-power field, 5 squamous epithelial cells, 6-10 WBCs and urine culture that showed multiple species present -Chest x-ray done on admission stable chest x-ray with no visible pneumonia however repeat chest x-ray this morning is pending -She had a CTA of the chest done which was negative for PE but was concerning for mucous plugging versus bronchopneumonia -Initial CBC had no leukocytosis noted and she has not had no leukocytosis last few checks -Initially noted to be hypothermic per RN 1 in the ED awaiting bed placement with objective temperature 96.5 and initially placed on a Bair hugger with improvement in her hypothermia -Urine Legionella antigen pending and urine Streptococcus antigen pending as well -Her MRSA PCR was negative -In the ED she received IV vancomycin, IV cefepime and IV Flagyl and then IV empiric antibiotics for changed to IV azithromycin and IV cefepime -She was placed on DuoNeb 3 mL twice daily scheduled and they involved every 8 as needed for wheezing or shortness of breath -Continuing Guaifenesin 1200 was p.o. twice daily -If patient is afebrile overnight can anticipate discharge back to her facility with hospice in the morning as we will change antibiotics to p.o. then  Probable pneumonia -CT angiogram chest done concerning for mucous plugging versus probably pneumonia. It was noted per ED please note that patient had presented from facility with shortness of breath. -See above -MRSA PCR was negative. -SLP evaluation done and recommended dysphagia 3 diet however we have upgraded this to Dysphagia 1 diet given daughter's request -Treatment as above -Supportive care.  Dehydration -Improved with gentle  IVF hydration. -No longer on IVF curently  Chronic Constipation -Continue senna docusate 1 tab p.o. twice daily, as well as 1 tab p.o. nightly as needed for mild constipation -Patient also has  mag citrate solution 1 bottle orally once as needed severe constipation as well as sorbitol 70% solution 30 mL p.o. daily as needed for moderate constipation  Goals of care: DNR POA -Patient is currently on hospice and will continue home medications of hydrocodone-acetaminophen 0.5 tablets p.o. 3 times daily as well as 0.5 mg of IV morphine every 4 hours as needed severe pain as well as 5 mg p.o. every 6 as needed for severe pain and shortness of breath -Continue with antiemetics with Zofran 4 mg p.o./IV every 6 as needed nausea -Continue with volume vinyl alcohol 1.4% ophthalmic drops 1 drop in both eyes and dry as needed  Anxiety -Continue with Duloxetine 30 mg p.o. daily   Chronic Nasal Congestion -C/w Cetirizine 10 mg po Daily Substitution with Loratadine 10 mg po Daily   Normocytic Anemia -Likely dilutional drop as patient's hemoglobin/hematocrit went from 13.4/42.9 on admission -> 11.6/37.0 -> 11.9/36.5 -Will not check Anemia Panel in the AM as patient is on Hospice as an outpatient and will not change management -Continue to Monitor for S/Sx of Bleeding; Currently no overt bleeding noted -Repeat CBC in the AM   Thrombocytopenia -Patient's Platelet Count went from 159 -> 144 -Likely in the setting of Infection as above -Continue to Monitor for S/Sx of Bleeding -Repeat CBC in the AM   DVT prophylaxis: Enoxaparin 40 mg sq q24h Code Status: FULL CODE  Family Communication: (Specify name, relationship & date discussed. NO "discussed with patient") Disposition Plan: Anticipating discharging back to her facility in the next 24 to 48 hours  Status is: Inpatient  Remains inpatient appropriate because:Unsafe d/c plan, IV treatments appropriate due to intensity of illness or inability to take PO and Inpatient level of care appropriate due to severity of illness   Dispo: The patient is from: SNF              Anticipated d/c is to: SNF              Patient currently is not medically  stable to d/c.   Difficult to place patient No   Consultants:   None  Procedures: None  Antimicrobials:  Anti-infectives (From admission, onward)   Start     Dose/Rate Route Frequency Ordered Stop   03/02/21 1400  ceFEPIme (MAXIPIME) 2 g in sodium chloride 0.9 % 100 mL IVPB        2 g 200 mL/hr over 30 Minutes Intravenous Every 24 hours 03/01/21 1633     03/02/21 1000  azithromycin (ZITHROMAX) 500 mg in sodium chloride 0.9 % 250 mL IVPB        500 mg 250 mL/hr over 60 Minutes Intravenous Every 24 hours 03/02/21 0846 03/07/21 0959   03/01/21 1330  ceFEPIme (MAXIPIME) 2 g in sodium chloride 0.9 % 100 mL IVPB        2 g 200 mL/hr over 30 Minutes Intravenous  Once 03/01/21 1322 03/01/21 1458   03/01/21 1330  metroNIDAZOLE (FLAGYL) IVPB 500 mg        500 mg 100 mL/hr over 60 Minutes Intravenous  Once 03/01/21 1322 03/01/21 1610   03/01/21 1330  vancomycin (VANCOCIN) IVPB 1000 mg/200 mL premix        1,000 mg 200 mL/hr over 60 Minutes Intravenous  Once 03/01/21 1322 03/01/21 1714  Subjective: Seen and examined at bedside she is pleasantly confused and in no acute distress.  No nausea or vomiting.  Denies any lightheadedness or dizziness.  Thought she was on a stepladder. No other concerns or complaints at this time.  Objective: Vitals:   03/03/21 0449 03/03/21 0450 03/03/21 0500 03/03/21 0859  BP: 140/75     Pulse: 76     Resp: 16     Temp: 97.7 F (36.5 C)     TempSrc: Oral     SpO2: (!) 88% 94%  93%  Weight:   34.2 kg   Height:        Intake/Output Summary (Last 24 hours) at 03/03/2021 1213 Last data filed at 03/03/2021 0600 Gross per 24 hour  Intake 290 ml  Output --  Net 290 ml   Filed Weights   03/01/21 1033 03/01/21 2100 03/03/21 0500  Weight: 40.8 kg 34.5 kg 34.2 kg   Examination: Physical Exam:  Constitutional: Thin cachectic elderly appearing pleasantly demented Caucasian female currently in no acute distress  Eyes: Lids and conjunctivae normal,  sclerae anicteric  ENMT: External Ears, Nose appear normal.  A little hard of hearing Neck: Appears normal, supple, no cervical masses, normal ROM, no appreciable thyromegaly; no JVD Respiratory: Diminished to auscultation bilaterally with coarse breath sounds worse on the right compared to the left, no wheezing, rales, rhonchi or crackles. Normal respiratory effort and patient is not tachypenic. No accessory muscle use.  Not wearing supplemental oxygen via nasal cannula Cardiovascular: RRR, no murmurs / rubs / gallops. S1 and S2 auscultated. No carotid bruits.  Abdomen: Soft, non-tender, non-distended. Bowel sounds positive.  GU: Deferred. Musculoskeletal: No clubbing / cyanosis of digits/nails. No joint deformity upper and lower extremities.  Skin: No rashes, lesions, ulcers on limited skin evaluation. No induration; Warm and dry.  Neurologic: CN 2-12 grossly intact with no focal deficits. Romberg sign and cerebellar reflexes not assessed.  Psychiatric: Impaired judgment and insight. Alert and oriented x 1. Normal mood and appropriate affect.   Data Reviewed: I have personally reviewed following labs and imaging studies  CBC: Recent Labs  Lab 03/01/21 1056 03/02/21 0824 03/02/21 1014 03/03/21 0516  WBC 9.6 7.6 7.6 7.9  NEUTROABS 7.5 5.7  --  6.3  HGB 13.4 11.4* 11.6* 11.9*  HCT 42.9 36.5 37.0 36.5  MCV 99.3 100.0 100.8* 96.3  PLT 159 146* PLATELET CLUMPS NOTED ON SMEAR, UNABLE TO ESTIMATE 144*   Basic Metabolic Panel: Recent Labs  Lab 03/01/21 1056 03/02/21 0428 03/02/21 0824 03/02/21 1014 03/03/21 0516  NA 141  --  138 137 137  K 3.8  --  4.1 4.3 3.8  CL 102  --  102 103 100  CO2 29  --  30 26 29   GLUCOSE 140*  --  93 96 89  BUN 20  --  11 12 9   CREATININE 0.56  --  0.36* <0.30* 0.37*  CALCIUM 8.7*  --  8.2* 8.3* 8.1*  MG  --  1.7  --   --  1.9   GFR: Estimated Creatinine Clearance: 19.7 mL/min (A) (by C-G formula based on SCr of 0.37 mg/dL (Grimes)). Liver Function  Tests: Recent Labs  Lab 03/01/21 1056  AST 19  ALT 11  ALKPHOS 79  BILITOT 0.3  PROT 7.0  ALBUMIN 3.9   No results for input(s): LIPASE, AMYLASE in the last 168 hours. No results for input(s): AMMONIA in the last 168 hours. Coagulation Profile: Recent Labs  Lab 03/01/21 1056  INR 1.1   Cardiac Enzymes: No results for input(s): CKTOTAL, CKMB, CKMBINDEX, TROPONINI in the last 168 hours. BNP (last 3 results) No results for input(s): PROBNP in the last 8760 hours. HbA1C: No results for input(s): HGBA1C in the last 72 hours. CBG: Recent Labs  Lab 03/01/21 1630  GLUCAP 114*   Lipid Profile: No results for input(s): CHOL, HDL, LDLCALC, TRIG, CHOLHDL, LDLDIRECT in the last 72 hours. Thyroid Function Tests: No results for input(s): TSH, T4TOTAL, FREET4, T3FREE, THYROIDAB in the last 72 hours. Anemia Panel: No results for input(s): VITAMINB12, FOLATE, FERRITIN, TIBC, IRON, RETICCTPCT in the last 72 hours. Sepsis Labs: Recent Labs  Lab 03/01/21 1056 03/01/21 1245 03/01/21 1716 03/02/21 0428  LATICACIDVEN 2.0* 2.7* 2.3* 1.2    Recent Results (from the past 240 hour(s))  Blood culture (routine single)     Status: None (Preliminary result)   Collection Time: 03/01/21 10:56 AM   Specimen: Site Not Specified; Blood  Result Value Ref Range Status   Specimen Description   Final    SITE NOT SPECIFIED Performed at [facility], [address], [place], [place] [address]    Special Requests   Final    BOTTLES DRAWN AEROBIC AND ANAEROBIC Blood Culture results may not be optimal due to an inadequate volume of blood received in culture bottles Performed at [facility], [address], [place], [place] [address]    Culture   Final    NO GROWTH 2 DAYS Performed at [facility], [address], [place], [place] [address]    Report Status PENDING  Incomplete  Resp Panel by RT-PCR (Flu A&B, Covid) Nasopharyngeal Swab     Status: None   Collection  Time: 03/01/21 11:08 AM   Specimen: Nasopharyngeal Swab; Nasopharyngeal(NP) swabs in vial transport medium  Result Value Ref Range Status   SARS Coronavirus 2 by RT PCR NEGATIVE NEGATIVE Final    Comment: (NOTE) SARS-CoV-2 target nucleic acids are NOT DETECTED.  The SARS-CoV-2 RNA is generally detectable in upper respiratory specimens during the acute phase of infection. The lowest concentration of SARS-CoV-2 viral copies this assay can detect is 138 copies/mL. A negative result does not preclude SARS-Cov-2 infection and should not be used as the sole basis for treatment or other patient management decisions. A negative result may occur with  improper specimen collection/handling, submission of specimen other than nasopharyngeal swab, presence of viral mutation(s) within the areas targeted by this assay, and inadequate number of viral copies(<138 copies/mL). A negative result must be combined with clinical observations, patient history, and epidemiological information. The expected result is Negative.  Fact Sheet for Patients:  BloggerCourse.com  Fact Sheet for Healthcare Providers:  SeriousBroker.it  This test is no t yet approved or cleared by the [place] FDA and  has been authorized for detection and/or diagnosis of SARS-CoV-2 by FDA under an Emergency Use Authorization (EUA). This EUA will remain  in effect (meaning this test can be used) for the duration of the COVID-19 declaration under Section 564(b)(1) of the Act, 21 U.S.C.section 360bbb-3(b)(1), unless the authorization is terminated  or revoked sooner.       Influenza A by PCR NEGATIVE NEGATIVE Final   Influenza B by PCR NEGATIVE NEGATIVE Final    Comment: (NOTE) The Xpert Xpress SARS-CoV-2/FLU/RSV plus assay is intended as an aid in the diagnosis of influenza from Nasopharyngeal swab specimens and should not be used as a sole basis for treatment. Nasal washings  and  aspirates are unacceptable for Xpert Xpress SARS-CoV-2/FLU/RSV testing.  Fact Sheet for Patients: BloggerCourse.com  Fact Sheet for Healthcare Providers: SeriousBroker.it  This test is not yet approved or cleared by the [place] FDA and has been authorized for detection and/or diagnosis of SARS-CoV-2 by FDA under an Emergency Use Authorization (EUA). This EUA will remain in effect (meaning this test can be used) for the duration of the COVID-19 declaration under Section 564(b)(1) of the Act, 21 U.S.C. section 360bbb-3(b)(1), unless the authorization is terminated or revoked.  Performed at [facility], [address] [place], [place] [address]   Urine culture     Status: Abnormal   Collection Time: 03/01/21  2:10 PM   Specimen: In/Out Cath Urine  Result Value Ref Range Status   Specimen Description   Final    IN/OUT CATH URINE Performed at [facility], [address] [place], [place] [address]    Special Requests   Final    NONE Performed at [facility], [address], [place], [place] [address]    Culture MULTIPLE SPECIES PRESENT, SUGGEST RECOLLECTION (A)  Final   Report Status 03/03/2021 FINAL  Final  Culture, blood (Routine X 2) w Reflex to ID Panel     Status: None (Preliminary result)   Collection Time: 03/01/21  5:16 PM   Specimen: BLOOD  Result Value Ref Range Status   Specimen Description   Final    BLOOD LEFT ANTECUBITAL Performed at [facility], [address], [place], [place] [address]    Special Requests   Final    BOTTLES DRAWN AEROBIC ONLY Blood Culture results may not be optimal due to an inadequate volume of blood received in culture bottles Performed at [facility], [address], [place], [place] [address]    Culture   Final    NO GROWTH 2 DAYS Performed at [facility], [address], [place], [place] [address]    Report Status PENDING  Incomplete  MRSA PCR Screening     Status: None   Collection Time: 03/01/21  9:15 PM   Specimen: Nasopharyngeal  Result Value Ref Range Status   MRSA by PCR NEGATIVE NEGATIVE Final    Comment:        The GeneXpert MRSA Assay (FDA approved for NASAL specimens only), is one component of a comprehensive MRSA colonization surveillance program. It is not intended to diagnose MRSA infection nor to guide or monitor treatment for MRSA infections. Performed at [facility], [address], [place], [place] [address]   Culture, blood (Routine X 2) w Reflex to ID Panel     Status: None (Preliminary result)   Collection Time: 03/02/21  4:28 AM   Specimen: BLOOD LEFT HAND  Result Value Ref Range Status   Specimen Description   Final    BLOOD LEFT HAND Performed at [facility], [address] [place], [place] [address]    Special Requests   Final    BOTTLES DRAWN AEROBIC ONLY Blood Culture adequate volume Performed at [facility], [address], [place], [place] [address]    Culture   Final    NO GROWTH 1 DAY Performed at [facility], [address], [place], [place] [address]    Report Status PENDING  Incomplete     RN Pressure Injury Documentation:     Estimated body mass index is 13.79 kg/m as calculated  from the following:   Height as of this encounter: 5\' 2"  (1.575 m).   Weight as of this encounter: 34.2 kg.  Malnutrition Type:   Malnutrition Characteristics:   Nutrition Interventions:   Radiology Studies: CT Angio Chest PE W/Cm &/Or Wo Cm  Result Date: 03/01/2021 CLINICAL DATA:  Fever and shortness of breath. EXAM: CT ANGIOGRAPHY CHEST WITH CONTRAST TECHNIQUE: Multidetector CT imaging of the chest was performed using the standard protocol during bolus administration of intravenous contrast. Multiplanar CT image reconstructions and MIPs were  obtained to evaluate the vascular anatomy. CONTRAST:  57mL OMNIPAQUE IOHEXOL 350 MG/ML SOLN COMPARISON:  Chest x-ray from same day. CT chest dated March 06, 2014. FINDINGS: Cardiovascular: Satisfactory opacification of the pulmonary arteries to the segmental level. No evidence of pulmonary embolism. Normal heart size. No pericardial effusion. No thoracic aortic aneurysm. Coronary, aortic arch, and branch vessel atherosclerotic vascular disease. Mediastinum/Nodes: No enlarged mediastinal, hilar, or axillary lymph nodes. Thyroid gland, trachea, and esophagus demonstrate no significant findings. Lungs/Pleura: Biapical pleuroparenchymal scarring in noted. Mild chronic bronchiectasis and peripheral scarring in the right middle lobe. New distal airways mucous impaction and patchy densities in the posterior lingula. Minimal subsegmental atelectasis in the left lower lobe. No focal consolidation, pleural effusion, or pneumothorax. New 4 mm pulmonary nodule in the medial aspect of the left lower lobe (series 6, image 70). Upper Abdomen: No acute abnormality. Musculoskeletal: No chest wall abnormality. No acute or significant osseous findings. Multiple old right-sided rib fractures. Severe bilateral glenohumeral osteoarthritis. Exaggerated upper thoracic kyphosis. Review of the MIP images confirms the above findings. IMPRESSION: 1. No evidence of pulmonary embolism. 2. New distal airways mucous impaction and patchy densities in the posterior lingula may reflect atelectasis versus mild bronchopneumonia. 3. New 4 mm pulmonary nodule in the medial aspect of the left lower lobe. No further follow-up suggested due to patient age. 4. Aortic Atherosclerosis (ICD10-I70.0). Electronically Signed   By: Obie Dredge M.D.   On: 03/01/2021 15:24   Scheduled Meds: . Chlorhexidine Gluconate Cloth  6 each Topical Daily  . DULoxetine  30 mg Oral Daily  . enoxaparin (LOVENOX) injection  30 mg Subcutaneous Q24H  . famotidine  40 mg  Oral QHS  . guaiFENesin  1,200 mg Oral BID  . HYDROcodone-acetaminophen  0.5 tablet Oral TID  . ipratropium-albuterol  3 mL Nebulization BID  . loratadine  10 mg Oral Daily  . mouth rinse  15 mL Mouth Rinse BID  . senna-docusate  1 tablet Oral BID  . sodium chloride flush  3 mL Intravenous Q12H  . Vitamin D (Ergocalciferol)  50,000 Units Oral Q7 days   Continuous Infusions: . azithromycin Stopped (03/02/21 1120)  . ceFEPime (MAXIPIME) IV Stopped (03/02/21 1415)    LOS: 2 days   Merlene Laughter, DO [facility] PAGER is on AMION  If 7PM-7AM, please contact night-coverage [email or web address]

## 2021-03-04 ENCOUNTER — Inpatient Hospital Stay (HOSPITAL_COMMUNITY)

## 2021-03-04 DIAGNOSIS — I5033 Acute on chronic diastolic (congestive) heart failure: Secondary | ICD-10-CM

## 2021-03-04 LAB — RESP PANEL BY RT-PCR (FLU A&B, COVID) ARPGX2
Influenza A by PCR: NEGATIVE
Influenza B by PCR: NEGATIVE
SARS Coronavirus 2 by RT PCR: NEGATIVE

## 2021-03-04 LAB — CBC WITH DIFFERENTIAL/PLATELET
Abs Immature Granulocytes: 0.03 10*3/uL (ref 0.00–0.07)
Basophils Absolute: 0 10*3/uL (ref 0.0–0.1)
Basophils Relative: 0 %
Eosinophils Absolute: 0 10*3/uL (ref 0.0–0.5)
Eosinophils Relative: 0 %
HCT: 42.9 % (ref 36.0–46.0)
Hemoglobin: 13.5 g/dL (ref 12.0–15.0)
Immature Granulocytes: 0 %
Lymphocytes Relative: 12 %
Lymphs Abs: 1.1 10*3/uL (ref 0.7–4.0)
MCH: 31.3 pg (ref 26.0–34.0)
MCHC: 31.5 g/dL (ref 30.0–36.0)
MCV: 99.3 fL (ref 80.0–100.0)
Monocytes Absolute: 0.7 10*3/uL (ref 0.1–1.0)
Monocytes Relative: 8 %
Neutro Abs: 7.4 10*3/uL (ref 1.7–7.7)
Neutrophils Relative %: 80 %
Platelets: 177 10*3/uL (ref 150–400)
RBC: 4.32 MIL/uL (ref 3.87–5.11)
RDW: 12.7 % (ref 11.5–15.5)
WBC: 9.3 10*3/uL (ref 4.0–10.5)
nRBC: 0 % (ref 0.0–0.2)

## 2021-03-04 LAB — COMPREHENSIVE METABOLIC PANEL
ALT: 13 U/L (ref 0–44)
AST: 20 U/L (ref 15–41)
Albumin: 3.1 g/dL — ABNORMAL LOW (ref 3.5–5.0)
Alkaline Phosphatase: 91 U/L (ref 38–126)
Anion gap: 9 (ref 5–15)
BUN: 7 mg/dL — ABNORMAL LOW (ref 8–23)
CO2: 31 mmol/L (ref 22–32)
Calcium: 8.5 mg/dL — ABNORMAL LOW (ref 8.9–10.3)
Chloride: 101 mmol/L (ref 98–111)
Creatinine, Ser: 0.32 mg/dL — ABNORMAL LOW (ref 0.44–1.00)
GFR, Estimated: >60 mL/min (ref >60)
Glucose, Bld: 92 mg/dL (ref 70–99)
Potassium: 3.6 mmol/L (ref 3.5–5.1)
Sodium: 141 mmol/L (ref 135–145)
Total Bilirubin: 0.5 mg/dL (ref 0.3–1.2)
Total Protein: 5.9 g/dL — ABNORMAL LOW (ref 6.5–8.1)

## 2021-03-04 LAB — MAGNESIUM: Magnesium: 1.8 mg/dL (ref 1.7–2.4)

## 2021-03-04 LAB — GLUCOSE, CAPILLARY: Glucose-Capillary: 70 mg/dL (ref 70–99)

## 2021-03-04 LAB — PHOSPHORUS: Phosphorus: 3 mg/dL (ref 2.5–4.6)

## 2021-03-04 MED ORDER — FAMOTIDINE 20 MG PO TABS: 20.0000 mg | ORAL_TABLET | Freq: Every day | ORAL | 0 refills | Status: AC

## 2021-03-04 MED ORDER — POLYVINYL ALCOHOL 1.4 % OP SOLN: 1.0000 [drp] | OPHTHALMIC | 0 refills | Status: AC | PRN

## 2021-03-04 MED ORDER — ACETAMINOPHEN 325 MG PO TABS: 650.0000 mg | ORAL_TABLET | Freq: Four times a day (QID) | ORAL | 0 refills | Status: AC | PRN

## 2021-03-04 MED ORDER — SORBITOL 70 % SOLN: 30.0000 mL | Freq: Every day | 0 refills | Status: AC | PRN

## 2021-03-04 MED ORDER — AZITHROMYCIN 250 MG PO TABS
500.0000 mg | ORAL_TABLET | Freq: Every day | ORAL | Status: DC
  Administered 2021-03-04: 500 mg via ORAL
  Filled 2021-03-04: qty 2

## 2021-03-04 MED ORDER — AZITHROMYCIN 500 MG PO TABS: 500.0000 mg | ORAL_TABLET | Freq: Every day | ORAL | 0 refills | Status: AC

## 2021-03-04 MED ORDER — MORPHINE SULFATE (CONCENTRATE) 20 MG/ML PO SOLN: 5.0000 mg | Freq: Four times a day (QID) | ORAL | 0 refills | Status: AC | PRN

## 2021-03-04 MED ORDER — GUAIFENESIN ER 600 MG PO TB12: 600.0000 mg | ORAL_TABLET | Freq: Two times a day (BID) | ORAL | 0 refills | Status: AC

## 2021-03-04 MED ORDER — ONDANSETRON HCL 4 MG PO TABS: 4.0000 mg | ORAL_TABLET | Freq: Four times a day (QID) | ORAL | 0 refills | Status: AC | PRN

## 2021-03-04 MED ORDER — HYDROCODONE-ACETAMINOPHEN 5-325 MG PO TABS: 0.5000 | ORAL_TABLET | Freq: Three times a day (TID) | ORAL | 0 refills | Status: AC

## 2021-03-04 MED ORDER — CEFDINIR 300 MG PO CAPS: 300.0000 mg | ORAL_CAPSULE | Freq: Every day | ORAL | 0 refills | Status: AC

## 2021-03-04 MED ORDER — CEFDINIR 300 MG PO CAPS
300.0000 mg | ORAL_CAPSULE | Freq: Every day | ORAL | Status: DC
  Administered 2021-03-04: 300 mg via ORAL
  Filled 2021-03-04: qty 1

## 2021-03-04 MED ORDER — MAGNESIUM SULFATE 2 GM/50ML IV SOLN
2.0000 g | Freq: Once | INTRAVENOUS | Status: AC
  Administered 2021-03-04: 2 g via INTRAVENOUS
  Filled 2021-03-04: qty 50

## 2021-03-04 MED ORDER — FUROSEMIDE 10 MG/ML IJ SOLN
20.0000 mg | Freq: Once | INTRAMUSCULAR | Status: AC
  Administered 2021-03-04: 20 mg via INTRAVENOUS
  Filled 2021-03-04: qty 2

## 2021-03-04 NOTE — [place] FL2 (Signed)
[place] MEDICAID FL2 LEVEL OF CARE SCREENING TOOL     IDENTIFICATION  Patient Name: MALLARY KREGER Birthdate: [birth year] Sex: female Admission Date (Current Location): 03/01/2021  [facility] and [place] Number:  Producer, television/film/video and Address:  [facility],  [address] [place], [place] [address]      Provider Number: [id number]  Attending Physician Name and Address:  Merlene Laughter, DO  Relative Name and Phone Number:  Edmonia James (Daughter)   [phone]    Current Level of Care: Hospital Recommended Level of Care: Assisted Living Facility Prior Approval Number:    Date Approved/Denied:   PASRR Number:    Discharge Plan: Domiciliary (Rest home) [facility] with hospice care)    Current Diagnoses: Patient Active Problem List   Diagnosis Date Noted  . Fever 03/01/2021  . PNA (pneumonia) : Probable 03/01/2021  . Dehydration 03/01/2021  . Hypothermia 03/01/2021  . Fall 03/07/2014  . Right scapula fracture 03/07/2014  . Multiple rib fractures 03/06/2014    Orientation RESPIRATION BLADDER Height & Weight     Self  O2 (3L [place]) Incontinent Weight: 34.1 kg Height:  5\' 2"  (157.5 cm)  BEHAVIORAL SYMPTOMS/MOOD NEUROLOGICAL BOWEL NUTRITION STATUS   (none)  (none) Incontinent Diet (Mechanical Soft, Thin liquids)  AMBULATORY STATUS COMMUNICATION OF NEEDS Skin   Total Care Verbally Normal                       Personal Care Assistance Level of Assistance  Bathing,Feeding,Dressing Bathing Assistance: Maximum assistance Feeding assistance: Limited assistance Dressing Assistance: Maximum assistance     Functional Limitations Info  Sight,Hearing,Speech Sight Info: Adequate Hearing Info: Impaired Speech Info: Adequate    SPECIAL CARE FACTORS FREQUENCY                       Contractures Contractures Info: Not present    Additional Factors Info  Code Status,Allergies Code Status Info: DNR Allergies Info: Augmentin            Discharge Medications:   acetaminophen 325 MG tablet Commonly known as: TYLENOL Take 2 tablets (650 mg total) by mouth every 6 (six) hours as needed for mild pain (or Fever >/= 101).         Icon medications to start taking   azithromycin 500 MG tablet Commonly known as: ZITHROMAX Take 1 tablet (500 mg total) by mouth daily.         Icon medications to start taking   cefdinir 300 MG capsule Commonly known as: OMNICEF Take 1 capsule (300 mg total) by mouth daily.          cetirizine 10 MG tablet Commonly known as: ZYRTEC Take 10 mg by mouth daily.          diclofenac sodium 1 % Gel Commonly known as: VOLTAREN Apply 2 g topically 2 (two) times daily as needed (pain).          DULoxetine 30 MG capsule Commonly known as: CYMBALTA Take 30 mg by mouth daily.         Icon medications to change how you take   famotidine 20 MG tablet Commonly known as: PEPCID Take 1 tablet (20 mg total) by mouth at bedtime. What changed:  0 medication strength 0 how much to take         Icon medications to start taking   guaiFENesin 600 MG 12 hr tablet Commonly known as: MUCINEX Take 1 tablet (  600 mg total) by mouth 2 (two) times daily for 5 days.          HYDROcodone-acetaminophen 5-325 MG tablet Commonly known as: NORCO/VICODIN Take 0.5 tablets by mouth 3 (three) times daily.          hydroxypropyl methylcellulose / hypromellose 2.5 % ophthalmic solution Commonly known as: ISOPTO TEARS / GONIOVISC Place 1 drop into both eyes 3 (three) times daily.          ipratropium-albuterol 0.5-2.5 (3) MG/3ML Soln Commonly known as: DUONEB Take 3 mLs by nebulization every 8 (eight) hours as needed (wheezing /SOB).          mineral oil liquid Place 0.1 mLs in ear(s) once a week. Both ears          morphine 20 MG/ML concentrated solution Commonly known as: ROXANOL Take 0.25 mLs (5 mg total) by mouth every 6 (six) hours as needed for severe pain or shortness of breath.         Icon medications to start taking    ondansetron 4 MG tablet Commonly known as: ZOFRAN Take 1 tablet (4 mg total) by mouth every 6 (six) hours as needed for nausea.          OXYGEN Inhale 2 L into the lungs daily as needed (SOB).         Icon medications to start taking   polyvinyl alcohol 1.4 % ophthalmic solution Commonly known as: LIQUIFILM TEARS Place 1 drop into both eyes as needed for dry eyes.          SENNA PLUS PO Take 2 tablets by mouth 2 (two) times daily.         Icon medications to start taking   sorbitol 70 % Soln Take 30 mLs by mouth daily as needed for moderate constipation.          Vitamin D (Ergocalciferol) 1.25 MG (50000 UNIT) Caps capsule Commonly known as: DRISDOL Take 50,000 Units by mouth every 7 (seven) days.            Relevant Imaging Results:  Relevant Lab Results:   Additional Information    Ida Rogue, LCSW

## 2021-03-04 NOTE — Discharge Summary (Addendum)
Physician Discharge Summary  Lindsey Grimes ZOX:[id number] DOB: 03-Jan-1917 DOA: 03/01/2021  PCP: Audree Bane, DO  Admit date: 03/01/2021 Discharge date: 03/04/2021  Admitted From: Home Disposition: Home with Hospice   Recommendations for Outpatient Follow-up:  1. Follow up with PCP and Hospice in 1-2 weeks 2. Please obtain CMP/CBC, Mag, Phos in one week 3. Repeat CXR in 3-6 weeks 4. Please follow up on the following pending results:  Home Health: No  Equipment/Devices: None  Discharge Condition: Stable CODE STATUS: DO NOT RESUSCITATE  Diet recommendation: Dysphagia 1 Diet   Brief/Interim Summary: Patient is a 87 year old elderly cachectic Caucasian female with past medical history significant for but not limited to MS, chronic constipation, hyperlipidemia, degenerative disc disease, anxiety as well as other comorbidities who currently resides at [facility] and followed by hospice and presented to the ED with shortness of breath and fever.  Initially had elevated lactic acid levels which were then trended down.  Urinalysis showed trace leukocytes, negative nitrites, 6-10 WBCs.  COVID-19 PCR was negative.  Influenza A and B PCR were negative at that time.  Chest x-ray done was unremarkable but a CTA of the chest done negative for PE but concerning for mucus impaction and possible mild bronchopneumonia.  Patient was pancultured admitted and started on empiric IV antibiotics.  Given that she was hypothermic in the ED she was placed on Bair hugger and admitted to the stepdown unit.  Subsequently she improved has been afebrile since then and remains on the medical floor.  SLP initially recommended dysphagia 3 thin liquids but then diet was downgraded to dysphagia 1 diet.    Since she is improved has been afebrile will discharge back to her facility today with hospice.  Prior to discharge she appeared slightly volume overloaded so we will give one-time dose of IV Lasix and will have her follow-up in  outpatient setting with her PCP and follow-up with hospice protocol as an outpatient  Discharge Diagnoses:  Principal Problem:   Fever Active Problems:   PNA (pneumonia) : Probable   Dehydration   Hypothermia  Fever and hypothermia likely secondary to a bronchopneumonia, present on admission, improved; SEPSIS ruled out -He brought to the ED due to concerns for fever shortness of breath as well as productive cough -Had an elevated lactic acid level of 2.7 and this trended down to 1.2 -Blood cultures x2 show no growth to date and 1 day -Repeat chest x-ray ordered for this morning and pending -Urinalysis on admission showed a clear appearance with negative hemoglobin, negative ketones, trace leukocytes, negative nitrites, rare bacteria, 0-5 RBCs per high-power field, 5 squamous epithelial cells, 6-10 WBCs and urine culture that showed multiple species present -Chest x-ray done on admission stable chest x-ray with no visible pneumonia however repeat chest x-ray yesterday showed "Minimal left basilar subsegmental atelectasis or infiltrate is noted with probable small left pleural effusion." -She had a CTA of the chest done which was negative for PE but was concerning for mucous plugging versus bronchopneumonia -Initial CBC had no leukocytosis noted and she has not had no leukocytosis last few checks -Initially noted to be hypothermic per RN 1 in the ED awaiting bed placement with objective temperature 96.5 and initially placed on a Bair hugger with improvement in her hypothermia -Urine Legionella antigen pending and urine Streptococcus antigen pending as well -Her MRSA PCR was negative -In the ED she received IV vancomycin, IV cefepime and IV Flagyl and then IV empiric antibiotics for changed to IV azithromycin  and IV cefepime but will change to po Azithromycin and po Cefdinir  -She was placed on DuoNeb 3 mL twice daily scheduled and they involved every 8 as needed for wheezing or shortness of  breath -Continuing Guaifenesin 1200 was p.o. twice daily but will reduce to 600 mg po BID x5 days -Since patient was afebrile overnight will discharge back to her facility with hospice as we have changed antibiotics to p.o.  Left Basilar Pneumonia -CT angiogram chest done concerning for mucous plugging versus probably pneumonia. It was noted per ED please note that patient had presented from facility with shortness of breath. -See above -MRSA PCR was negative. -SLP evaluation done and recommended dysphagia 3 diet however we have upgraded this to Dysphagia 1 diet given daughter's request -Treatment as above -Repeat CXR showed "Cardiomegaly with bilateral interstitial prominence. Small bilateral pleural effusions. Findings suggest mild CHF." -Supportive care.  Dehydration -Improved with gentle IVF hydration. -No longer on IVF curently but will give a dose of IV Lasix given today's CXR findings  Chronic Constipation -Continue senna docusate 1 tab p.o. twice daily, as well as 1 tab p.o. nightly as needed for mild constipation -Patient also has mag citrate solution 1 bottle orally once as needed severe constipation as well as sorbitol 70% solution 30 mL p.o. daily as needed for moderate constipation  Goals of care: DNR POA -Patient is currently on hospice and will continue home medications of hydrocodone-acetaminophen 0.5 tablets p.o. 3 times daily as well as 0.5 mg of IV morphine every 4 hours as needed severe pain as well as 5 mg p.o. every 6 as needed for severe pain and shortness of breath -Continue with antiemetics with Zofran 4 mg p.o./IV every 6 as needed nausea -Continue with volume vinyl alcohol 1.4% ophthalmic drops 1 drop in both eyes and dry as needed  Suspected Mild Acute Diastolic CHF -Currently unknown Etiology given no ECHO but suspect Diastolic -Give a 1x dose of IV Lasix 20 mg -Continue to Monitor Strict I's and O's and Daily Weights -Patient is +1.682 Liters since  admission with Weight being the same -Repeat CXR this AM showed "Cardiomegaly with bilateral interstitial prominence. Small bilateral pleural effusions. Findings suggest mild CHF." -Will need repeat CXR in 3-6 weeks  -Continue to Monitor for S/Sx of Volume Overload as an outpatient   Anxiety -Continue with Duloxetine 30 mg p.o. daily   Chronic Nasal Congestion -C/w Cetirizine 10 mg po Daily Substitution with Loratadine 10 mg po Daily while hospitalized and go back to Cetirizine as an outpatient   Normocytic Anemia -Likely dilutional drop as patient's hemoglobin/hematocrit went from 13.4/42.9 on admission -> 11.6/37.0 -> 11.9/36.5 -> 13.5/42.9 -Will not check Anemia Panel in the AM as patient is on Hospice as an outpatient and will not change management -Continue to Monitor for S/Sx of Bleeding; Currently no overt bleeding noted -Repeat CBC in the AM   Thrombocytopenia -Patient's Platelet Count went from 159 -> 144 -> 177 -Likely in the setting of Infection as above -Continue to Monitor for S/Sx of Bleeding -Repeat CBC in the AM   Discharge Instructions  Discharge Instructions    Call MD for:  difficulty breathing, headache or visual disturbances   Complete by: As directed    Call MD for:  extreme fatigue   Complete by: As directed    Call MD for:  hives   Complete by: As directed    Call MD for:  persistant dizziness or light-headedness   Complete by: As directed  Call MD for:  persistant nausea and vomiting   Complete by: As directed    Call MD for:  redness, tenderness, or signs of infection (pain, swelling, redness, odor or green/yellow discharge around incision site)   Complete by: As directed    Call MD for:  severe uncontrolled pain   Complete by: As directed    Call MD for:  temperature >100.4   Complete by: As directed    Diet - low sodium heart healthy   Complete by: As directed    Discharge instructions   Complete by: As directed    You were cared for by a  hospitalist during your hospital stay. If you have any questions about your discharge medications or the care you received while you were in the hospital after you are discharged, you can call the unit and ask to speak with the hospitalist on call if the hospitalist that took care of you is not available. Once you are discharged, your primary care physician will handle any further medical issues. Please note that NO REFILLS for any discharge medications will be authorized once you are discharged, as it is imperative that you return to your primary care physician (or establish a relationship with a primary care physician if you do not have one) for your aftercare needs so that they can reassess your need for medications and monitor your lab values.  Follow up with PCP and Hospice per their protocol. Take all medications as prescribed. If symptoms change or worsen please return to the ED for evaluation   Increase activity slowly   Complete by: As directed      Allergies as of 03/04/2021      Reactions   Augmentin [amoxicillin-pot Clavulanate] Other (See Comments)   unknown      Medication List    STOP taking these medications   traMADol 50 MG tablet Commonly known as: ULTRAM     TAKE these medications   acetaminophen 325 MG tablet Commonly known as: TYLENOL Take 2 tablets (650 mg total) by mouth every 6 (six) hours as needed for mild pain (or Fever >/= 101).   azithromycin 500 MG tablet Commonly known as: ZITHROMAX Take 1 tablet (500 mg total) by mouth daily.   cefdinir 300 MG capsule Commonly known as: OMNICEF Take 1 capsule (300 mg total) by mouth daily.   cetirizine 10 MG tablet Commonly known as: ZYRTEC Take 10 mg by mouth daily.   diclofenac sodium 1 % Gel Commonly known as: VOLTAREN Apply 2 g topically 2 (two) times daily as needed (pain).   DULoxetine 30 MG capsule Commonly known as: CYMBALTA Take 30 mg by mouth daily.   famotidine 20 MG tablet Commonly known as:  PEPCID Take 1 tablet (20 mg total) by mouth at bedtime. What changed:   medication strength  how much to take   guaiFENesin 600 MG 12 hr tablet Commonly known as: MUCINEX Take 1 tablet (600 mg total) by mouth 2 (two) times daily for 5 days.   HYDROcodone-acetaminophen 5-325 MG tablet Commonly known as: NORCO/VICODIN Take 0.5 tablets by mouth 3 (three) times daily.   hydroxypropyl methylcellulose / hypromellose 2.5 % ophthalmic solution Commonly known as: ISOPTO TEARS / GONIOVISC Place 1 drop into both eyes 3 (three) times daily.   ipratropium-albuterol 0.5-2.5 (3) MG/3ML Soln Commonly known as: DUONEB Take 3 mLs by nebulization every 8 (eight) hours as needed (wheezing /SOB).   mineral oil liquid Place 0.1 mLs in ear(s) once a week. Both  ears   morphine 20 MG/ML concentrated solution Commonly known as: ROXANOL Take 0.25 mLs (5 mg total) by mouth every 6 (six) hours as needed for severe pain or shortness of breath.   ondansetron 4 MG tablet Commonly known as: ZOFRAN Take 1 tablet (4 mg total) by mouth every 6 (six) hours as needed for nausea.   OXYGEN Inhale 2 L into the lungs daily as needed (SOB).   polyvinyl alcohol 1.4 % ophthalmic solution Commonly known as: LIQUIFILM TEARS Place 1 drop into both eyes as needed for dry eyes.   SENNA PLUS PO Take 2 tablets by mouth 2 (two) times daily.   sorbitol 70 % Soln Take 30 mLs by mouth daily as needed for moderate constipation.   Vitamin D (Ergocalciferol) 1.25 MG (50000 UNIT) Caps capsule Commonly known as: DRISDOL Take 50,000 Units by mouth every 7 (seven) days.       Follow-up Information    Audree Bane, DO. Call.   Specialty: Family Medicine Why: Follow up within 1-2 weeks Contact information: [address] [place] [address] [phone]              Allergies  Allergen Reactions  . Augmentin [Amoxicillin-Pot Clavulanate] Other (See Comments)    unknown    Consultations:  None  Procedures/Studies: CT Angio Chest PE W/Cm &/Or Wo Cm  Result Date: 03/01/2021 CLINICAL DATA:  Fever and shortness of breath. EXAM: CT ANGIOGRAPHY CHEST WITH CONTRAST TECHNIQUE: Multidetector CT imaging of the chest was performed using the standard protocol during bolus administration of intravenous contrast. Multiplanar CT image reconstructions and MIPs were obtained to evaluate the vascular anatomy. CONTRAST:  31mL OMNIPAQUE IOHEXOL 350 MG/ML SOLN COMPARISON:  Chest x-ray from same day. CT chest dated March 06, 2014. FINDINGS: Cardiovascular: Satisfactory opacification of the pulmonary arteries to the segmental level. No evidence of pulmonary embolism. Normal heart size. No pericardial effusion. No thoracic aortic aneurysm. Coronary, aortic arch, and branch vessel atherosclerotic vascular disease. Mediastinum/Nodes: No enlarged mediastinal, hilar, or axillary lymph nodes. Thyroid gland, trachea, and esophagus demonstrate no significant findings. Lungs/Pleura: Biapical pleuroparenchymal scarring in noted. Mild chronic bronchiectasis and peripheral scarring in the right middle lobe. New distal airways mucous impaction and patchy densities in the posterior lingula. Minimal subsegmental atelectasis in the left lower lobe. No focal consolidation, pleural effusion, or pneumothorax. New 4 mm pulmonary nodule in the medial aspect of the left lower lobe (series 6, image 70). Upper Abdomen: No acute abnormality. Musculoskeletal: No chest wall abnormality. No acute or significant osseous findings. Multiple old right-sided rib fractures. Severe bilateral glenohumeral osteoarthritis. Exaggerated upper thoracic kyphosis. Review of the MIP images confirms the above findings. IMPRESSION: 1. No evidence of pulmonary embolism. 2. New distal airways mucous impaction and patchy densities in the posterior lingula may reflect atelectasis versus mild bronchopneumonia. 3. New 4 mm pulmonary nodule in the  medial aspect of the left lower lobe. No further follow-up suggested due to patient age. 4. Aortic Atherosclerosis (ICD10-I70.0). Electronically Signed   By: Obie Dredge M.D.   On: 03/01/2021 15:24   DG CHEST PORT 1 VIEW  Result Date: 03/04/2021 CLINICAL DATA:  Shortness of breath. EXAM: PORTABLE CHEST 1 VIEW COMPARISON:  03/03/2021. FINDINGS: Mediastinum and hilar structures normal. Cardiomegaly with bilateral interstitial prominence. Small bilateral pleural effusions. Findings suggest CHF. No pneumothorax. Degenerative changes both shoulders. Degenerative changes scoliosis thoracic spine. IMPRESSION: Cardiomegaly with bilateral interstitial prominence. Small bilateral pleural effusions. Findings suggest mild CHF. Electronically Signed   By: Maisie Fus  Register   On: 03/04/2021 07:46   DG CHEST PORT 1 VIEW  Result Date: 03/03/2021 CLINICAL DATA:  Pneumonia. EXAM: PORTABLE CHEST 1 VIEW COMPARISON:  March 01, 2021. FINDINGS: Stable cardiomediastinal silhouette. No pneumothorax is noted. Right lung is clear. Minimal left basilar atelectasis or infiltrate is noted with probable small left pleural effusion. Bony thorax is unremarkable. IMPRESSION: Minimal left basilar subsegmental atelectasis or infiltrate is noted with probable small left pleural effusion. Electronically Signed   By: Lupita Raider M.D.   On: 03/03/2021 13:26   DG Chest Port 1 View  Result Date: 03/01/2021 CLINICAL DATA:  Questionable sepsis EXAM: PORTABLE CHEST 1 VIEW COMPARISON:  12/15/2017 FINDINGS: Normal heart size and mediastinal contours. No acute infiltrate or edema. No effusion or pneumothorax. No acute osseous findings. IMPRESSION: Stable chest.  No visible pneumonia. Electronically Signed   By: Marnee Spring M.D.   On: 03/01/2021 10:56    Subjective: Seen and examined at bedside and she is doing fairly well.  Wanting "out".  Denies any chest pain, shortness breath, lightheadedness or dizziness.  Is awake and alert and  oriented x1.  No other concerns or complaints at this time and ready to go back to her facility as she has been afebrile for last 48 hours.  Discharge Exam: Vitals:   03/04/21 0802 03/04/21 0837  BP:    Pulse:    Resp:    Temp:  98.2 F (36.8 C)  SpO2: 96%    Vitals:   03/03/21 2138 03/04/21 0416 03/04/21 0802 03/04/21 0837  BP:  (!) 160/78    Pulse:  86    Resp:  20    Temp:    98.2 F (36.8 C)  TempSrc:    Oral  SpO2: 96% (!) 79% 96%   Weight:  34.1 kg    Height:       General: Pt is awake, not in acute distress Cardiovascular: RRR, S1/S2 +, no rubs, no gallops Respiratory: Diminished bilaterally with coarse breath sounds and slight crackles and mild rhonchi, no wheezing, Wearing supplemental O2 via [place] Abdominal: Soft, NT, ND, bowel sounds + Extremities: no edema, no cyanosis  The results of significant diagnostics from this hospitalization (including imaging, microbiology, ancillary and laboratory) are listed below for reference.    Microbiology: Recent Results (from the past 240 hour(s))  Blood culture (routine single)     Status: None (Preliminary result)   Collection Time: 03/01/21 10:56 AM   Specimen: Site Not Specified; Blood  Result Value Ref Range Status   Specimen Description   Final    SITE NOT SPECIFIED Performed at [facility], [address], [place], [place] [address]    Special Requests   Final    BOTTLES DRAWN AEROBIC AND ANAEROBIC Blood Culture results may not be optimal due to an inadequate volume of blood received in culture bottles Performed at [facility], [address] [place], [place] [address]    Culture   Final    NO GROWTH 3 DAYS Performed at [facility], [address] [place], [place] [address]    Report Status PENDING  Incomplete  Resp Panel by RT-PCR (Flu A&B, Covid) Nasopharyngeal Swab     Status: None   Collection Time: 03/01/21 11:08 AM   Specimen: Nasopharyngeal Swab; Nasopharyngeal(NP) swabs  in vial transport medium  Result Value Ref Range Status   SARS Coronavirus 2 by RT PCR NEGATIVE NEGATIVE Final    Comment: (NOTE) SARS-CoV-2  target nucleic acids are NOT DETECTED.  The SARS-CoV-2 RNA is generally detectable in upper respiratory specimens during the acute phase of infection. The lowest concentration of SARS-CoV-2 viral copies this assay can detect is 138 copies/mL. A negative result does not preclude SARS-Cov-2 infection and should not be used as the sole basis for treatment or other patient management decisions. A negative result may occur with  improper specimen collection/handling, submission of specimen other than nasopharyngeal swab, presence of viral mutation(s) within the areas targeted by this assay, and inadequate number of viral copies(<138 copies/mL). A negative result must be combined with clinical observations, patient history, and epidemiological information. The expected result is Negative.  Fact Sheet for Patients:  BloggerCourse.com  Fact Sheet for Healthcare Providers:  SeriousBroker.it  This test is no t yet approved or cleared by the [place] FDA and  has been authorized for detection and/or diagnosis of SARS-CoV-2 by FDA under an Emergency Use Authorization (EUA). This EUA will remain  in effect (meaning this test can be used) for the duration of the COVID-19 declaration under Section 564(b)(1) of the Act, 21 U.S.C.section 360bbb-3(b)(1), unless the authorization is terminated  or revoked sooner.       Influenza A by PCR NEGATIVE NEGATIVE Final   Influenza B by PCR NEGATIVE NEGATIVE Final    Comment: (NOTE) The Xpert Xpress SARS-CoV-2/FLU/RSV plus assay is intended as an aid in the diagnosis of influenza from Nasopharyngeal swab specimens and should not be used as a sole basis for treatment. Nasal washings and aspirates are unacceptable for Xpert Xpress  SARS-CoV-2/FLU/RSV testing.  Fact Sheet for Patients: BloggerCourse.com  Fact Sheet for Healthcare Providers: SeriousBroker.it  This test is not yet approved or cleared by the [place] FDA and has been authorized for detection and/or diagnosis of SARS-CoV-2 by FDA under an Emergency Use Authorization (EUA). This EUA will remain in effect (meaning this test can be used) for the duration of the COVID-19 declaration under Section 564(b)(1) of the Act, 21 U.S.C. section 360bbb-3(b)(1), unless the authorization is terminated or revoked.  Performed at [facility], [address], [place], [place] [address]   Urine culture     Status: Abnormal   Collection Time: 03/01/21  2:10 PM   Specimen: In/Out Cath Urine  Result Value Ref Range Status   Specimen Description   Final    IN/OUT CATH URINE Performed at [facility], [address], [place], [place] [address]    Special Requests   Final    NONE Performed at [facility], [address] [place], [place] [address]    Culture MULTIPLE SPECIES PRESENT, SUGGEST RECOLLECTION (A)  Final   Report Status 03/03/2021 FINAL  Final  Culture, blood (Routine X 2) w Reflex to ID Panel     Status: None (Preliminary result)   Collection Time: 03/01/21  5:16 PM   Specimen: BLOOD  Result Value Ref Range Status   Specimen Description   Final    BLOOD LEFT ANTECUBITAL Performed at [facility], [address], [place], [place] [address]    Special Requests   Final    BOTTLES DRAWN AEROBIC ONLY Blood Culture results may not be optimal due to an inadequate volume of blood received in culture bottles Performed at [facility], [address], [place], [place] [address]    Culture   Final    NO GROWTH 3 DAYS Performed at [facility], [address] [place], [place] [address]    Report Status  PENDING  Incomplete  MRSA PCR Screening     Status: None   Collection Time: 03/01/21  9:15 PM   Specimen: Nasopharyngeal  Result Value Ref Range Status   MRSA by PCR NEGATIVE NEGATIVE Final    Comment:        The GeneXpert MRSA Assay (FDA approved for NASAL specimens only), is one component of a comprehensive MRSA colonization surveillance program. It is not intended to diagnose MRSA infection nor to guide or monitor treatment for MRSA infections. Performed at [facility], [address], [place], [place] [address]   Culture, blood (Routine X 2) w Reflex to ID Panel     Status: None (Preliminary result)   Collection Time: 03/02/21  4:28 AM   Specimen: BLOOD LEFT HAND  Result Value Ref Range Status   Specimen Description   Final    BLOOD LEFT HAND Performed at [facility], [address], [place], [place] [address]    Special Requests   Final    BOTTLES DRAWN AEROBIC ONLY Blood Culture adequate volume Performed at [facility], [address] [place], [place] [address]    Culture   Final    NO GROWTH 2 DAYS Performed at [facility], [address] [place], [place] [address]    Report Status PENDING  Incomplete    Labs: BNP (last 3 results) No results for input(s): BNP in the last 8760 hours. Basic Metabolic Panel: Recent Labs  Lab 03/01/21 1056 03/02/21 0428 03/02/21 0824 03/02/21 1014 03/03/21 0516 03/04/21 0520  NA 141  --  138 137 137 141  K 3.8  --  4.1 4.3 3.8 3.6  CL 102  --  102 103 100 101  CO2 29  --  30 26 29 31   GLUCOSE 140*  --  93 96 89 92  BUN 20  --  11 12 9  7*  CREATININE 0.56  --  0.36* <0.30* 0.37* 0.32*  CALCIUM 8.7*  --  8.2* 8.3* 8.1* 8.5*  MG  --  1.7  --   --  1.9 1.8  PHOS  --   --   --   --   --  3.0   Liver Function Tests: Recent Labs  Lab 03/01/21 1056 03/04/21 0520  AST 19 20  ALT 11 13  ALKPHOS 79 91  BILITOT 0.3 0.5  PROT 7.0 5.9*  ALBUMIN 3.9 3.1*    No results for input(s): LIPASE, AMYLASE in the last 168 hours. No results for input(s): AMMONIA in the last 168 hours. CBC: Recent Labs  Lab 03/01/21 1056 03/02/21 0824 03/02/21 1014 03/03/21 0516 03/04/21 0520  WBC 9.6 7.6 7.6 7.9 9.3  NEUTROABS 7.5 5.7  --  6.3 7.4  HGB 13.4 11.4* 11.6* 11.9* 13.5  HCT 42.9 36.5 37.0 36.5 42.9  MCV 99.3 100.0 100.8* 96.3 99.3  PLT 159 146* PLATELET CLUMPS NOTED ON SMEAR, UNABLE TO ESTIMATE 144* 177   Cardiac Enzymes: No results for input(s): CKTOTAL, CKMB, CKMBINDEX, TROPONINI in the last 168 hours. BNP: Invalid input(s): POCBNP CBG: Recent Labs  Lab 03/01/21 1630 03/04/21 0823  GLUCAP 114* 70   D-Dimer No results for input(s): DDIMER in the last 72 hours. Hgb A1c No results for input(s): HGBA1C in the last 72 hours. Lipid Profile No results for input(s): CHOL, HDL, LDLCALC, TRIG, CHOLHDL, LDLDIRECT in the last 72 hours. Thyroid function studies No results  for input(s): TSH, T4TOTAL, T3FREE, THYROIDAB in the last 72 hours.  Invalid input(s): FREET3 Anemia work up No results for input(s): VITAMINB12, FOLATE, FERRITIN, TIBC, IRON, RETICCTPCT in the last 72 hours. Urinalysis    Component Value Date/Time   COLORURINE YELLOW 03/01/2021 1410   APPEARANCEUR CLEAR 03/01/2021 1410   LABSPEC 1.020 03/01/2021 1410   PHURINE 6.0 03/01/2021 1410   GLUCOSEU NEGATIVE 03/01/2021 1410   HGBUR NEGATIVE 03/01/2021 1410   BILIRUBINUR NEGATIVE 03/01/2021 1410   KETONESUR 5 (A) 03/01/2021 1410   PROTEINUR NEGATIVE 03/01/2021 1410   UROBILINOGEN 0.2 03/06/2014 1140   NITRITE NEGATIVE 03/01/2021 1410   LEUKOCYTESUR TRACE (A) 03/01/2021 1410   Sepsis Labs Invalid input(s): PROCALCITONIN,  WBC,  LACTICIDVEN Microbiology Recent Results (from the past 240 hour(s))  Blood culture (routine single)     Status: None (Preliminary result)   Collection Time: 03/01/21 10:56 AM   Specimen: Site Not Specified; Blood  Result Value Ref Range Status    Specimen Description   Final    SITE NOT SPECIFIED Performed at [facility], [address] [place], [place] [address]    Special Requests   Final    BOTTLES DRAWN AEROBIC AND ANAEROBIC Blood Culture results may not be optimal due to an inadequate volume of blood received in culture bottles Performed at [facility], [address] [place], [place] [address]    Culture   Final    NO GROWTH 3 DAYS Performed at [facility], [address] [place], [place] [address]    Report Status PENDING  Incomplete  Resp Panel by RT-PCR (Flu A&B, Covid) Nasopharyngeal Swab     Status: None   Collection Time: 03/01/21 11:08 AM   Specimen: Nasopharyngeal Swab; Nasopharyngeal(NP) swabs in vial transport medium  Result Value Ref Range Status   SARS Coronavirus 2 by RT PCR NEGATIVE NEGATIVE Final    Comment: (NOTE) SARS-CoV-2 target nucleic acids are NOT DETECTED.  The SARS-CoV-2 RNA is generally detectable in upper respiratory specimens during the acute phase of infection. The lowest concentration of SARS-CoV-2 viral copies this assay can detect is 138 copies/mL. A negative result does not preclude SARS-Cov-2 infection and should not be used as the sole basis for treatment or other patient management decisions. A negative result may occur with  improper specimen collection/handling, submission of specimen other than nasopharyngeal swab, presence of viral mutation(s) within the areas targeted by this assay, and inadequate number of viral copies(<138 copies/mL). A negative result must be combined with clinical observations, patient history, and epidemiological information. The expected result is Negative.  Fact Sheet for Patients:  BloggerCourse.com  Fact Sheet for Healthcare Providers:  SeriousBroker.it  This test is no t yet approved or cleared by the [place] FDA and  has been authorized for detection  and/or diagnosis of SARS-CoV-2 by FDA under an Emergency Use Authorization (EUA). This EUA will remain  in effect (meaning this test can be used) for the duration of the COVID-19 declaration under Section 564(b)(1) of the Act, 21 U.S.C.section 360bbb-3(b)(1), unless the authorization is terminated  or revoked sooner.       Influenza A by PCR NEGATIVE NEGATIVE Final   Influenza B by PCR NEGATIVE NEGATIVE Final    Comment: (NOTE) The Xpert Xpress SARS-CoV-2/FLU/RSV plus assay is intended as an aid in the diagnosis of influenza from Nasopharyngeal swab specimens and should not be used as a sole basis for treatment. Nasal washings and aspirates are unacceptable for  Xpert Xpress SARS-CoV-2/FLU/RSV testing.  Fact Sheet for Patients: BloggerCourse.com  Fact Sheet for Healthcare Providers: SeriousBroker.it  This test is not yet approved or cleared by the [place] FDA and has been authorized for detection and/or diagnosis of SARS-CoV-2 by FDA under an Emergency Use Authorization (EUA). This EUA will remain in effect (meaning this test can be used) for the duration of the COVID-19 declaration under Section 564(b)(1) of the Act, 21 U.S.C. section 360bbb-3(b)(1), unless the authorization is terminated or revoked.  Performed at [facility], [address], [place], [place] [address]   Urine culture     Status: Abnormal   Collection Time: 03/01/21  2:10 PM   Specimen: In/Out Cath Urine  Result Value Ref Range Status   Specimen Description   Final    IN/OUT CATH URINE Performed at [facility], [address] [place], [place] [address]    Special Requests   Final    NONE Performed at [facility], [address], [place], [place] [address]    Culture MULTIPLE SPECIES PRESENT, SUGGEST RECOLLECTION (A)  Final   Report Status 03/03/2021 FINAL  Final  Culture, blood (Routine  X 2) w Reflex to ID Panel     Status: None (Preliminary result)   Collection Time: 03/01/21  5:16 PM   Specimen: BLOOD  Result Value Ref Range Status   Specimen Description   Final    BLOOD LEFT ANTECUBITAL Performed at [facility], [address], [place], [place] [address]    Special Requests   Final    BOTTLES DRAWN AEROBIC ONLY Blood Culture results may not be optimal due to an inadequate volume of blood received in culture bottles Performed at [facility], [address] [place], [place] [address]    Culture   Final    NO GROWTH 3 DAYS Performed at [facility], [address] [place], [place] [address]    Report Status PENDING  Incomplete  MRSA PCR Screening     Status: None   Collection Time: 03/01/21  9:15 PM   Specimen: Nasopharyngeal  Result Value Ref Range Status   MRSA by PCR NEGATIVE NEGATIVE Final    Comment:        The GeneXpert MRSA Assay (FDA approved for NASAL specimens only), is one component of a comprehensive MRSA colonization surveillance program. It is not intended to diagnose MRSA infection nor to guide or monitor treatment for MRSA infections. Performed at [facility], [address], [place], [place] [address]   Culture, blood (Routine X 2) w Reflex to ID Panel     Status: None (Preliminary result)   Collection Time: 03/02/21  4:28 AM   Specimen: BLOOD LEFT HAND  Result Value Ref Range Status   Specimen Description   Final    BLOOD LEFT HAND Performed at [facility], [address], [place], [place] [address]    Special Requests   Final    BOTTLES DRAWN AEROBIC ONLY Blood Culture adequate volume Performed at [facility], [address], [place], [place] [address]    Culture   Final    NO GROWTH 2 DAYS Performed at [facility], [address] [place], [place] [address]    Report Status PENDING  Incomplete   Time coordinating  discharge: 35 minutes  SIGNED:  Merlene Laughter, DO [facility] 03/04/2021, 11:12 AM Pager is on AMION  If 7PM-7AM, please contact night-coverage [email or web address]

## 2021-03-04 NOTE — Clinical Social Work Note (Signed)
Lindsey, Grimes #[id number] (CSN: [id number]) ([phone] y.o. F) (Adm: 03/01/21) [phone]  Microbiology Results  Procedure Component Value Units Date/Time  Resp Panel by RT-PCR (Flu A&B, Covid) Nasopharyngeal Swab [[id number]] Collected: 03/04/21 1215  Order Status: Completed Specimen: Nasopharyngeal(NP) swabs in vial transport medium from Nasopharyngeal Swab Updated: 03/04/21 1302   SARS Coronavirus 2 by RT PCR NEGATIVE   Comment: (NOTE)  SARS-CoV-2 target nucleic acids are NOT DETECTED.   The SARS-CoV-2 RNA is generally detectable in upper respiratory  specimens during the acute phase of infection. The lowest  concentration of SARS-CoV-2 viral copies this assay can detect is  138 copies/mL. A negative result does not preclude SARS-Cov-2  infection and should not be used as the sole basis for treatment or  other patient management decisions. A negative result may occur with  improper specimen collection/handling, submission of specimen other  than nasopharyngeal swab, presence of viral mutation(s) within the  areas targeted by this assay, and inadequate number of viral  copies(<138 copies/mL). A negative result must be combined with  clinical observations, patient history, and epidemiological  information. The expected result is Negative.   Fact Sheet for Patients:  BloggerCourse.com   Fact Sheet for Healthcare Providers:  SeriousBroker.it   This test is not yet approved or cleared by the [place] FDA and  has been authorized for detection and/or diagnosis of SARS-CoV-2 by  FDA under an Emergency Use Authorization (EUA). This EUA will remain  in effect (meaning this test can be used) for the duration of the  COVID-19 declaration under Section 564(b)(1) of the Act, 21  U.S.C.section 360bbb-3(b)(1), unless the authorization is terminated  or revoked sooner.        Influenza A by PCR NEGATIVE   Influenza B by PCR NEGATIVE    Comment: (NOTE)  The Xpert Xpress SARS-CoV-2/FLU/RSV plus assay is intended as an aid  in the diagnosis of influenza from Nasopharyngeal swab specimens and  should not be used as a sole basis for treatment. Nasal washings and  aspirates are unacceptable for Xpert Xpress SARS-CoV-2/FLU/RSV  testing.   Fact Sheet for Patients:  BloggerCourse.com   Fact Sheet for Healthcare Providers:  SeriousBroker.it   This test is not yet approved or cleared by the [place] FDA and  has been authorized for detection and/or diagnosis of SARS-CoV-2 by  FDA under an Emergency Use Authorization (EUA). This EUA will remain  in effect (meaning this test can be used) for the duration of the  COVID-19 declaration under Section 564(b)(1) of the Act, 21 U.S.C.  section 360bbb-3(b)(1), unless the authorization is terminated or  revoked.   Performed at [facility], [address]  [place], [place] [address]

## 2021-03-04 NOTE — Progress Notes (Signed)
Went over discharge paperwork with patient daughter Edmonia James. Called [facility] and left a message for Almira Coaster to give me a call back for report.

## 2021-03-04 NOTE — TOC Transition Note (Signed)
Transition of Care [facility]) - CM/SW Discharge Note   Patient Details  Name: Lindsey Grimes MRN: [id number] Date of Birth: October 28, 1917  [facility] of [facility]) CM/SW Contact:  Ida Rogue, LCSW Phone Number: 03/04/2021, 2:23 PM   Clinical Narrative:   Patient who is stable for d/c is returning to [facility].  All required paperwork faxed to Gina at [phone]. Transportation arranged through [employer] per [facility] protocol.  Nursing, please call report to Gina at [phone]. TOC sign off.     Final next level of care: Assisted Living Barriers to Discharge: Barriers Resolved   Patient Goals and CMS Choice Patient states their goals for this hospitalization and ongoing recovery are:: not stated at this time   Choice offered to / list presented to : Patient  Discharge Placement                       Discharge Plan and Services   Discharge Planning Services: CM Consult                                 Social Determinants of Health (SDOH) Interventions     Readmission Risk Interventions No flowsheet data found.

## 2021-03-06 LAB — CULTURE, BLOOD (ROUTINE X 2): Culture: NO GROWTH

## 2021-03-06 LAB — CULTURE, BLOOD (SINGLE): Culture: NO GROWTH

## 2021-03-07 LAB — CULTURE, BLOOD (ROUTINE X 2)
Culture: NO GROWTH
Special Requests: ADEQUATE

## 2021-04-16 ENCOUNTER — Encounter (HOSPITAL_COMMUNITY): Payer: Self-pay

## 2021-04-16 ENCOUNTER — Other Ambulatory Visit: Payer: Self-pay

## 2021-04-16 ENCOUNTER — Emergency Department (HOSPITAL_COMMUNITY): Payer: Medicare PPO

## 2021-04-16 ENCOUNTER — Emergency Department (HOSPITAL_COMMUNITY)
Admission: EM | Admit: 2021-04-16 | Discharge: 2021-04-16 | Disposition: A | Discharge status: Home / Self Care | Payer: Medicare PPO | Attending: Emergency Medicine | Admitting: Emergency Medicine

## 2021-04-16 DIAGNOSIS — R0689 Other abnormalities of breathing: Secondary | ICD-10-CM | POA: Insufficient documentation

## 2021-04-16 DIAGNOSIS — R0989 Other specified symptoms and signs involving the circulatory and respiratory systems: Secondary | ICD-10-CM

## 2021-04-16 NOTE — Discharge Instructions (Addendum)
There is a small 1 cm x 1 cm nodule in the left lower lobe.  This will need follow-up imaging in 1 year.

## 2021-04-16 NOTE — ED Notes (Signed)
Per Dr. Myrtis Ser, ok to turn off VS monitors.

## 2021-04-16 NOTE — ED Notes (Signed)
An After Visit Summary was printed and given to the patient. Discharge instructions given and no further questions at this time.  Pt leaving in car with daughter to go back to [facility].

## 2021-04-16 NOTE — ED Provider Notes (Signed)
[facility]   CSN: [id number] Arrival date & time: 04/16/21  1621     History Chief Complaint  Patient presents with  . Possible Aspiration    Lindsey Grimes is a 87 y.o. female.  Patient has gurgling sound.  Family thought maybe she needs suction.  Facility sent her here for waxing waning oxygen.  Family is concerned for aspiration.  No fevers.  Eating and drinking well.  Mental status has been at baseline but may be a little more fatigued than usual.  No recent falls eating and drinking normally normal bowel function urinary function.  Patient is in hospice care.        Past Medical History:  Diagnosis Date  . Anxiety   . Chronic constipation   . Chronic nasal congestion   . Degenerative disc disease   . Hyperlipidemia   . Multiple sclerosis [facility])     Patient Active Problem List   Diagnosis Date Noted  . Fever 03/01/2021  . PNA (pneumonia) : Probable 03/01/2021  . Dehydration 03/01/2021  . Hypothermia 03/01/2021  . Fall 03/07/2014  . Right scapula fracture 03/07/2014  . Multiple rib fractures 03/06/2014    History reviewed. No pertinent surgical history.   OB History   No obstetric history on file.     History reviewed. No pertinent family history.  Social History   Tobacco Use  . Smoking status: Never Smoker  . Smokeless tobacco: Never Used  Vaping Use  . Vaping Use: Never used  Substance Use Topics  . Alcohol use: No  . Drug use: No    Home Medications Prior to Admission medications   Medication Sig Start Date End Date Taking? Authorizing Provider  acetaminophen (TYLENOL) 325 MG tablet Take 2 tablets (650 mg total) by mouth every 6 (six) hours as needed for mild pain (or Fever >/= 101). 03/04/21   Sheikh, Omair Latif, DO  azithromycin (ZITHROMAX) 500 MG tablet Take 1 tablet (500 mg total) by mouth daily. 03/04/21   Marguerita Merles Latif, DO  cefdinir (OMNICEF) 300 MG capsule Take 1 capsule (300 mg  total) by mouth daily. 03/04/21   Marguerita Merles Latif, DO  cetirizine (ZYRTEC) 10 MG tablet Take 10 mg by mouth daily.    [provider]  diclofenac sodium (VOLTAREN) 1 % GEL Apply 2 g topically 2 (two) times daily as needed (pain).    [provider]  DULoxetine (CYMBALTA) 30 MG capsule Take 30 mg by mouth daily.    [provider]  famotidine (PEPCID) 20 MG tablet Take 1 tablet (20 mg total) by mouth at bedtime. 03/04/21   Marguerita Merles Latif, DO  HYDROcodone-acetaminophen (NORCO/VICODIN) 5-325 MG tablet Take 0.5 tablets by mouth 3 (three) times daily. 03/04/21   Marguerita Merles Latif, DO  hydroxypropyl methylcellulose / hypromellose (ISOPTO TEARS / GONIOVISC) 2.5 % ophthalmic solution Place 1 drop into both eyes 3 (three) times daily.    [provider]  ipratropium-albuterol (DUONEB) 0.5-2.5 (3) MG/3ML SOLN Take 3 mLs by nebulization every 8 (eight) hours as needed (wheezing /SOB).    [provider]  mineral oil liquid Place 0.1 mLs in ear(s) once a week. Both ears    [provider]  morphine (ROXANOL) 20 MG/ML concentrated solution Take 0.25 mLs (5 mg total) by mouth every 6 (six) hours as needed for severe pain or shortness of breath. 03/04/21   Marguerita Merles Latif, DO  ondansetron (ZOFRAN) 4 MG tablet Take 1 tablet (  4 mg total) by mouth every 6 (six) hours as needed for nausea. 03/04/21   Marguerita Merles Latif, DO  OXYGEN Inhale 2 L into the lungs daily as needed (SOB).    [provider]  polyvinyl alcohol (LIQUIFILM TEARS) 1.4 % ophthalmic solution Place 1 drop into both eyes as needed for dry eyes. 03/04/21   Marguerita Merles Latif, DO  Sennosides-Docusate Sodium (SENNA PLUS PO) Take 2 tablets by mouth 2 (two) times daily.    [provider]  sorbitol 70 % SOLN Take 30 mLs by mouth daily as needed for moderate constipation. 03/04/21   Marguerita Merles Latif, DO  Vitamin D, Ergocalciferol, (DRISDOL) 1.25 MG (50000 UNIT) CAPS capsule  Take 50,000 Units by mouth every 7 (seven) days.    [provider]    Allergies    Augmentin [amoxicillin-pot clavulanate]  Review of Systems   Review of Systems  Constitutional: Negative for chills and fever.  HENT: Negative for congestion and rhinorrhea.   Respiratory: Positive for choking (gargling). Negative for cough and shortness of breath.   Cardiovascular: Negative for chest pain and palpitations.  Gastrointestinal: Negative for diarrhea, nausea and vomiting.  Genitourinary: Negative for difficulty urinating and dysuria.  Musculoskeletal: Negative for arthralgias and back pain.  Skin: Negative for rash and wound.  Neurological: Negative for light-headedness and headaches.    Physical Exam Updated Vital Signs BP (!) 163/91   Pulse 68   Temp 98.7 F (37.1 C) (Oral)   Resp 18   SpO2 98%   Physical Exam Vitals and nursing note reviewed. Exam conducted with a chaperone present.  Constitutional:      General: She is not in acute distress.    Appearance: Normal appearance.  HENT:     Head: Normocephalic and atraumatic.     Nose: No rhinorrhea.  Eyes:     General:        Right eye: No discharge.        Left eye: No discharge.     Conjunctiva/sclera: Conjunctivae normal.  Cardiovascular:     Rate and Rhythm: Normal rate and regular rhythm.  Pulmonary:     Effort: Pulmonary effort is normal. No respiratory distress.     Breath sounds: No stridor. No wheezing or rhonchi.  Chest:     Chest wall: No tenderness.  Abdominal:     General: Abdomen is flat. There is no distension.     Palpations: Abdomen is soft.  Musculoskeletal:        General: No tenderness or signs of injury.  Skin:    General: Skin is warm and dry.  Neurological:     General: No focal deficit present.     Mental Status: She is alert. Mental status is at baseline.     Motor: No weakness.  Psychiatric:        Mood and Affect: Mood normal.        Behavior: Behavior normal.     ED  Results / Procedures / Treatments   Labs (all labs ordered are listed, but only abnormal results are displayed) Labs Reviewed - No data to display  EKG None  Radiology DG Chest 2 View  Result Date: 04/16/2021 CLINICAL DATA:  Decreased oxygen saturation EXAM: CHEST - 2 VIEW COMPARISON:  March 04, 2021 FINDINGS: There is a nodular opacity in or overlying the left lower lobe region measuring 1.1 x 1.1 cm. No edema or airspace opacity. Heart size and pulmonary vascularity are normal. No adenopathy. There is  aortic atherosclerosis. There is extensive arthropathy in the right shoulder. IMPRESSION: 1.1 x 1.1 cm nodular opacity in or overlying left base. This finding may warrant noncontrast enhanced chest CT to further evaluate. No edema or airspace opacity. Heart size normal. Aortic Atherosclerosis (ICD10-I70.0). Electronically Signed   By: Bretta Bang III M.D.   On: 04/16/2021 17:54    Procedures Procedures   Medications Ordered in ED Medications - No data to display  ED Course  I have reviewed the triage vital signs and the nursing notes.  Pertinent labs & imaging results that were available during my care of the patient were reviewed by me and considered in my medical decision making (see chart for details).    MDM Rules/Calculators/A&P                          Patient has having gargling.  Family concern for aspiration.  Discussed risk benefits of evaluation given her goals of care.  Will get x-ray as possible antibiotics may be helpful.  Patient is in no need of blood work.  Family agrees.  No further pursuit of other significant pathology will be done, goal was to meet patient's comfort care request.  X-ray imaging reviewed by myself and radiology with no acute findings.  There is a small nodule.  It will need follow-up in 1 year.  Family is informed of this.  They are discharged home. Final Clinical Impression(s) / ED Diagnoses Final diagnoses:  Abnormal breath sounds    Rx  / DC Orders ED Discharge Orders    None       Sabino Donovan, MD 04/16/21 [phone]

## 2021-04-16 NOTE — ED Triage Notes (Signed)
Pt BIB EMS from [facility]. Facility reports pt diet just changed. Facility reports pt O2 was 69% on 2L O2 [place]. Pt is always on 2L [place]. EMS reports pt was 98%-100% on her baseline 2L [place]. Pt denies chest pain, denies SOB. Facility believes pt has aspirated. Pt has hx of aspiration pneumonia.   85 HR

## 2021-04-16 NOTE — ED Notes (Signed)
Pt daughter states she wants to take pt to [facility] in her car. Pt daughter wants staff to assist putting pt in pt daughter car.

## 2021-07-15 DEATH — deceased

## 2021-09-20 IMAGING — CT CT ANGIO CHEST
2 of 6 series · 18 of 36 positions shown · IV contrast (omnipaque)
Comparison: Chest x-ray from same day. CT chest dated March 06, 2014.

CLINICAL DATA: Fever and shortness of breath.

EXAM:
CT ANGIOGRAPHY CHEST WITH CONTRAST
TECHNIQUE: Multidetector CT imaging of the chest was performed using the
standard protocol during bolus administration of intravenous
contrast. Multiplanar CT image reconstructions and MIPs were
obtained to evaluate the vascular anatomy.
CONTRAST:  57mL OMNIPAQUE IOHEXOL 350 MG/ML SOLN

[Series 5: thins · axial · 0.62mm/px · z∈[-286,-25]mm · 17 of 295 slices shown]
[im 17/295  lung]
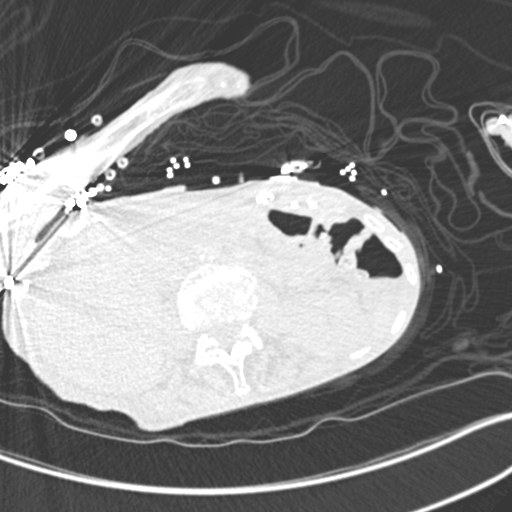
[im 33/295  mediastinal]
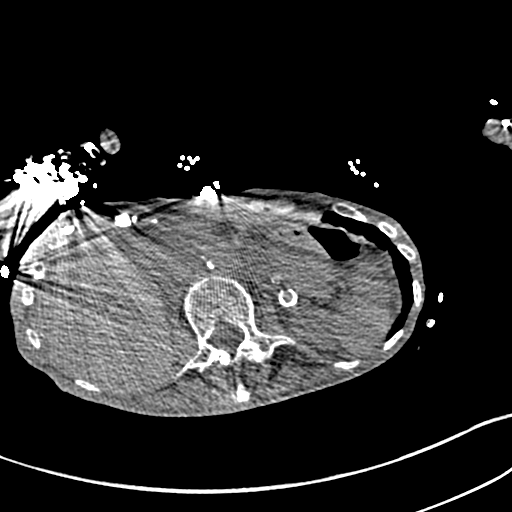
[im 50/295  lung]
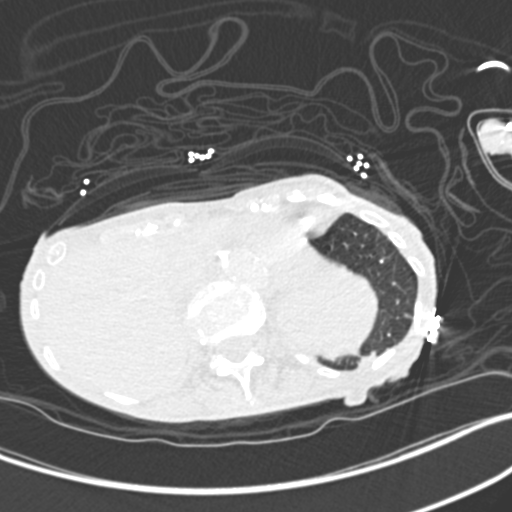
[im 66/295  mediastinal]
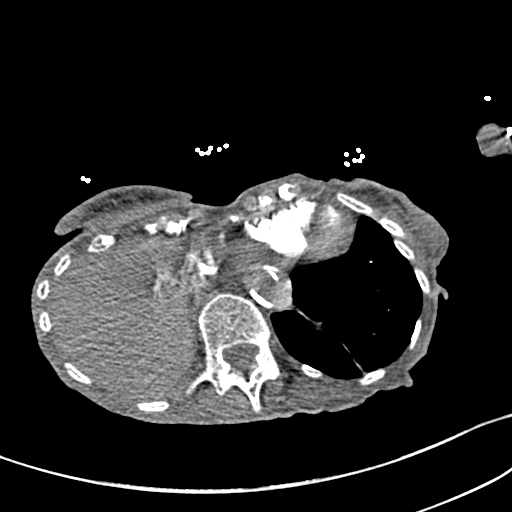
[im 82/295  lung]
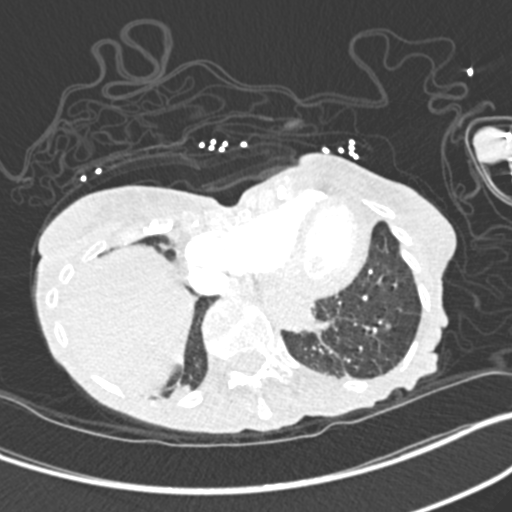
[im 99/295  mediastinal]
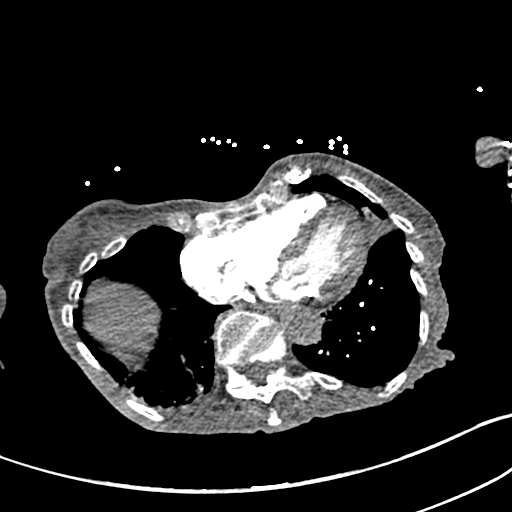
[im 115/295  lung]
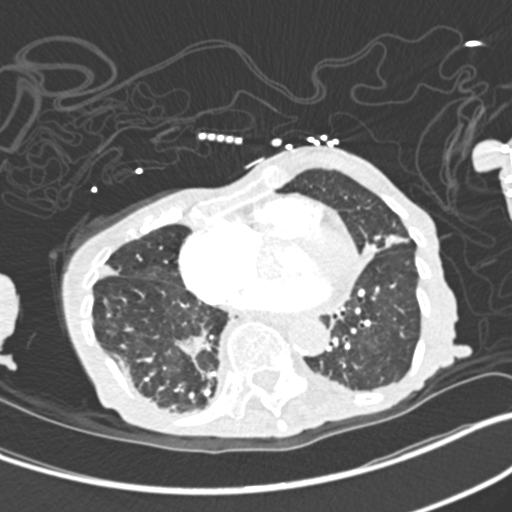
[im 131/295  mediastinal]
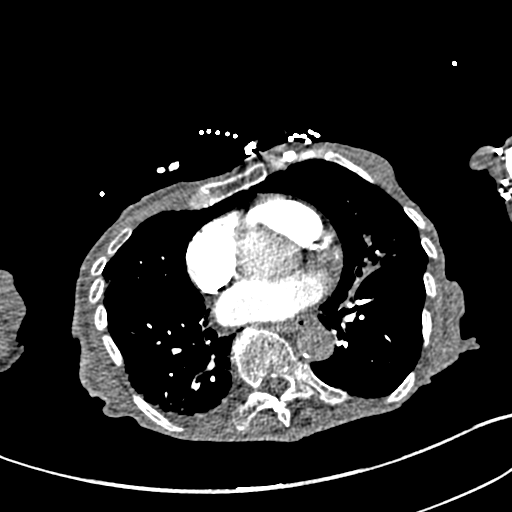
[im 148/295  lung]
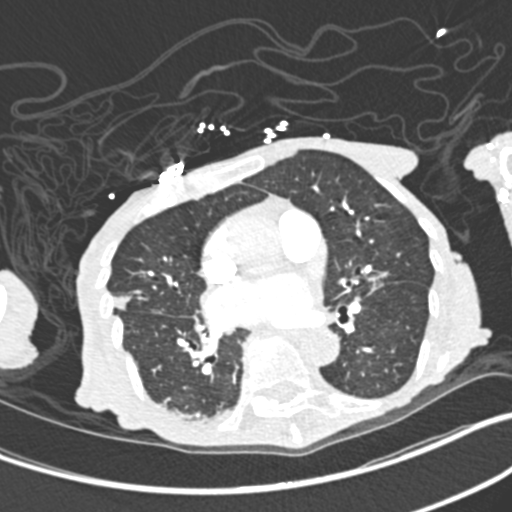
[im 164/295  mediastinal]
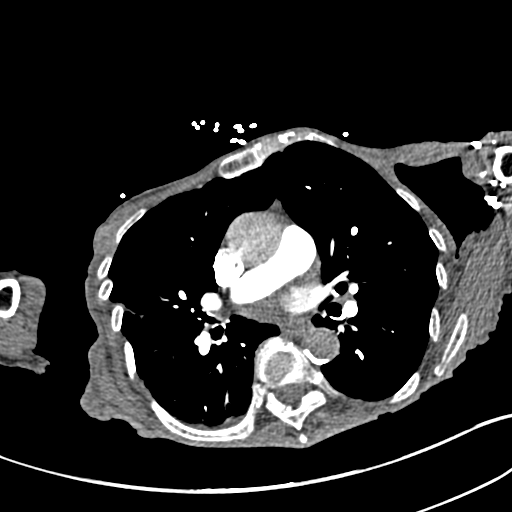
[im 180/295  lung]
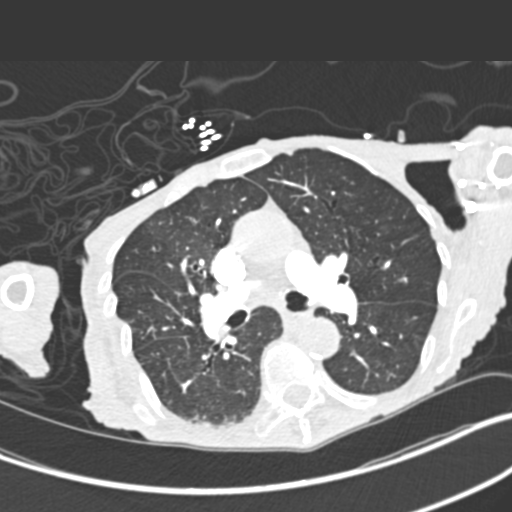
[im 197/295  mediastinal]
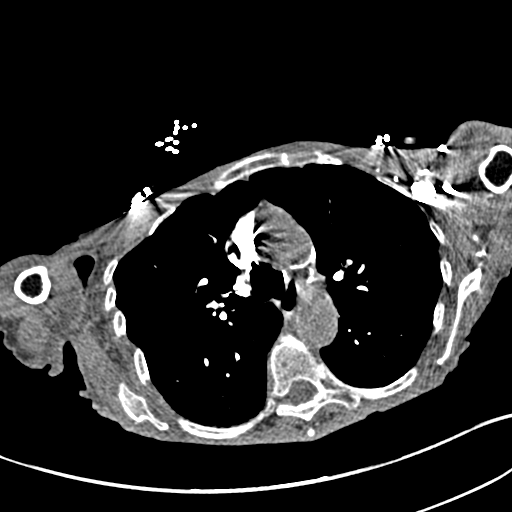
[im 213/295  lung]
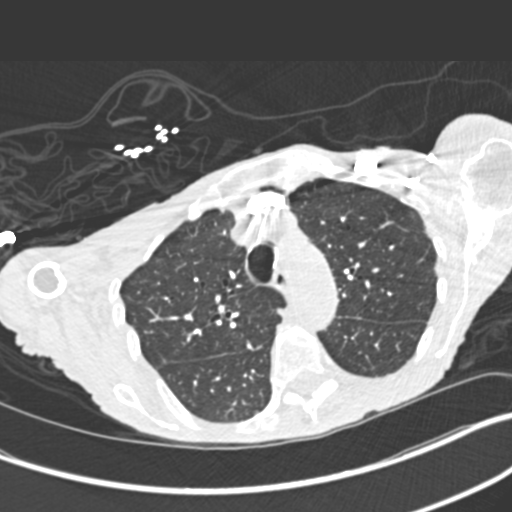
[im 229/295  mediastinal]
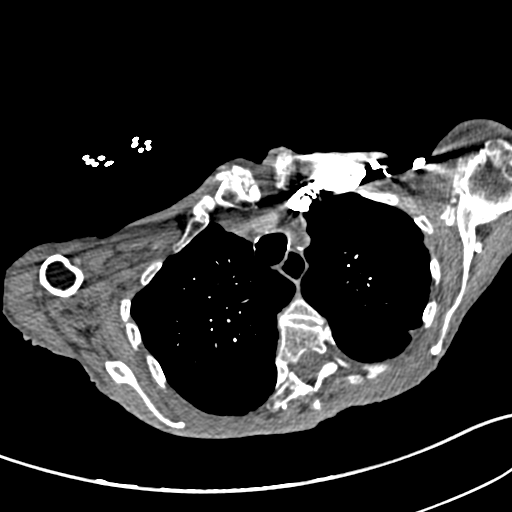
[im 245/295  lung]
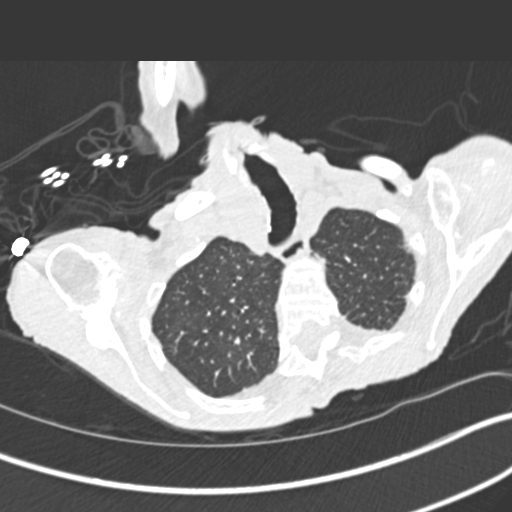
[im 262/295  mediastinal]
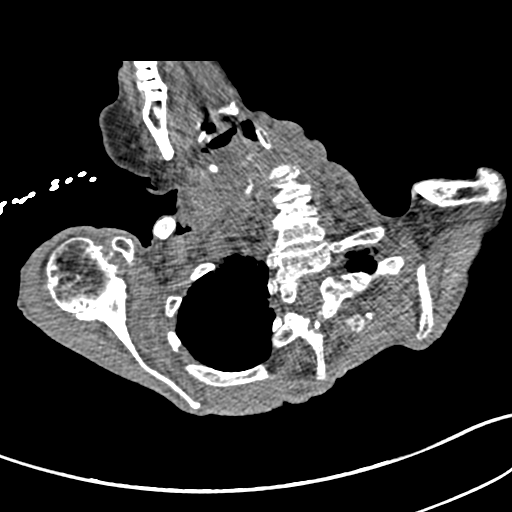
[im 278/295  lung]
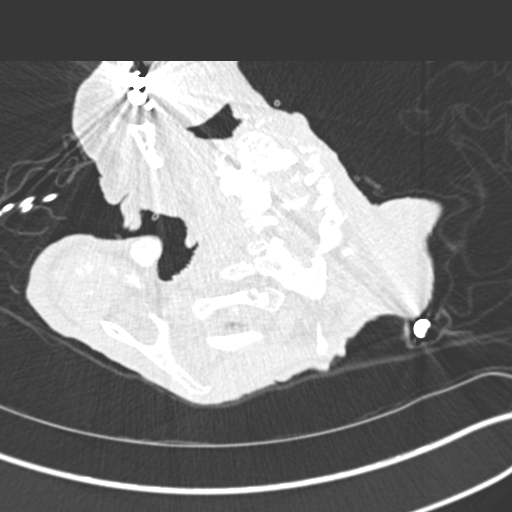

[Series 7: coronal mpr · coronal · 0.56mm/px · 1 of 109 slices shown]
[im 55/109  mediastinal]
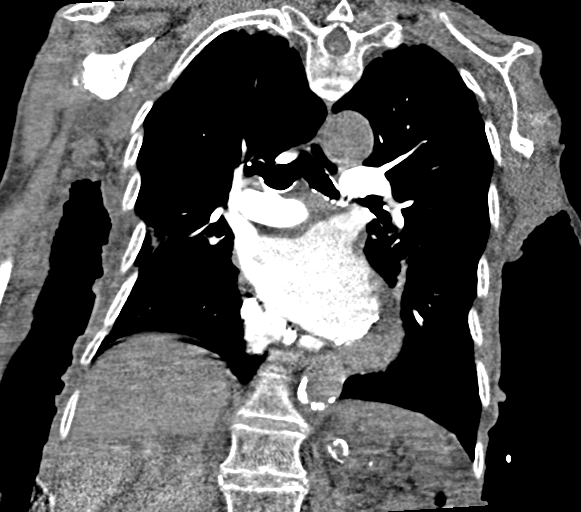

[18 of 36 positions shown; findings below may reference images not displayed]

FINDINGS: Cardiovascular: Satisfactory opacification of the pulmonary arteries
to the segmental level. No evidence of pulmonary embolism. Normal
heart size. No pericardial effusion. No thoracic aortic aneurysm.
Coronary, aortic arch, and branch vessel atherosclerotic vascular
disease.

Mediastinum/Nodes: No enlarged mediastinal, hilar, or axillary lymph
nodes. Thyroid gland, trachea, and esophagus demonstrate no
significant findings.

Lungs/Pleura: Biapical pleuroparenchymal scarring in noted. Mild
chronic bronchiectasis and peripheral scarring in the right middle
lobe. New distal airways mucous impaction and patchy densities in
the posterior lingula. Minimal subsegmental atelectasis in the left
lower lobe. No focal consolidation, pleural effusion, or
pneumothorax. New 4 mm pulmonary nodule in the medial aspect of the
left lower lobe (series 6, image 70).

Upper Abdomen: No acute abnormality.

Musculoskeletal: No chest wall abnormality. No acute or significant
osseous findings. Multiple old right-sided rib fractures. Severe
bilateral glenohumeral osteoarthritis. Exaggerated upper thoracic
kyphosis.

Review of the MIP images confirms the above findings.
IMPRESSION: 1. No evidence of pulmonary embolism.
2. New distal airways mucous impaction and patchy densities in the
posterior lingula may reflect atelectasis versus mild
bronchopneumonia.
3. New 4 mm pulmonary nodule in the medial aspect of the left lower
lobe. No further follow-up suggested due to patient age.
4. Aortic Atherosclerosis ([id number]).
